# Patient Record
Sex: Male | Born: 1970 | Race: White | Hispanic: No | Marital: Married | Smoking: Never smoker
Health system: Southern US, Community
[De-identification: ages and names within clinical notes are randomized; demographics above are authoritative.]

## PROBLEM LIST (undated history)

## (undated) DIAGNOSIS — K219 Gastro-esophageal reflux disease without esophagitis: Secondary | ICD-10-CM

## (undated) DIAGNOSIS — F29 Unspecified psychosis not due to a substance or known physiological condition: Secondary | ICD-10-CM

## (undated) DIAGNOSIS — F419 Anxiety disorder, unspecified: Secondary | ICD-10-CM

## (undated) HISTORY — PX: NO PAST SURGERIES: SHX2092

---

## 2017-05-25 ENCOUNTER — Ambulatory Visit
Admission: RE | Admit: 2017-05-25 | Discharge: 2017-05-25 | Disposition: A | Discharge status: Home / Self Care | Payer: Managed Care, Other (non HMO) | Source: Ambulatory Visit | Attending: Medical Oncology | Admitting: Medical Oncology

## 2017-05-25 ENCOUNTER — Other Ambulatory Visit: Payer: Self-pay | Admitting: Medical Oncology

## 2017-05-25 DIAGNOSIS — M25561 Pain in right knee: Secondary | ICD-10-CM

## 2017-05-25 DIAGNOSIS — M1711 Unilateral primary osteoarthritis, right knee: Secondary | ICD-10-CM | POA: Insufficient documentation

## 2018-10-14 ENCOUNTER — Encounter: Payer: Self-pay | Admitting: Emergency Medicine

## 2018-10-14 ENCOUNTER — Emergency Department
Admission: EM | Admit: 2018-10-14 | Discharge: 2018-10-15 | Disposition: A | Discharge status: Other Acute Inpatient Hospital | Payer: Managed Care, Other (non HMO) | Attending: Emergency Medicine | Admitting: Emergency Medicine

## 2018-10-14 ENCOUNTER — Other Ambulatory Visit: Payer: Self-pay

## 2018-10-14 DIAGNOSIS — R440 Auditory hallucinations: Secondary | ICD-10-CM | POA: Diagnosis present

## 2018-10-14 DIAGNOSIS — F329 Major depressive disorder, single episode, unspecified: Secondary | ICD-10-CM | POA: Diagnosis not present

## 2018-10-14 DIAGNOSIS — Z20828 Contact with and (suspected) exposure to other viral communicable diseases: Secondary | ICD-10-CM | POA: Insufficient documentation

## 2018-10-14 LAB — CBC
HCT: 41.9 % (ref 39.0–52.0)
Hemoglobin: 14.3 g/dL (ref 13.0–17.0)
MCH: 29.6 pg (ref 26.0–34.0)
MCHC: 34.1 g/dL (ref 30.0–36.0)
MCV: 86.7 fL (ref 80.0–100.0)
Platelets: 275 10*3/uL (ref 150–400)
RBC: 4.83 MIL/uL (ref 4.22–5.81)
RDW: 12.3 % (ref 11.5–15.5)
WBC: 6.7 10*3/uL (ref 4.0–10.5)
nRBC: 0 % (ref 0.0–0.2)

## 2018-10-14 LAB — COMPREHENSIVE METABOLIC PANEL
ALT: 21 U/L (ref 0–44)
AST: 21 U/L (ref 15–41)
Albumin: 4.5 g/dL (ref 3.5–5.0)
Alkaline Phosphatase: 65 U/L (ref 38–126)
Anion gap: 7 (ref 5–15)
BUN: 19 mg/dL (ref 6–20)
CO2: 26 mmol/L (ref 22–32)
Calcium: 9.1 mg/dL (ref 8.9–10.3)
Chloride: 106 mmol/L (ref 98–111)
Creatinine, Ser: 0.97 mg/dL (ref 0.61–1.24)
GFR calc Af Amer: >60 mL/min (ref >60)
GFR calc non Af Amer: >60 mL/min (ref >60)
Glucose, Bld: 107 mg/dL — ABNORMAL HIGH (ref 70–99)
Potassium: 3.7 mmol/L (ref 3.5–5.1)
Sodium: 139 mmol/L (ref 135–145)
Total Bilirubin: 1.1 mg/dL (ref 0.3–1.2)
Total Protein: 7.5 g/dL (ref 6.5–8.1)

## 2018-10-14 LAB — URINE DRUG SCREEN, QUALITATIVE (ARMC ONLY)
Amphetamines, Ur Screen: NOT DETECTED
Barbiturates, Ur Screen: NOT DETECTED
Benzodiazepine, Ur Scrn: NOT DETECTED
Cannabinoid 50 Ng, Ur ~~LOC~~: NOT DETECTED
Cocaine Metabolite,Ur ~~LOC~~: NOT DETECTED
MDMA (Ecstasy)Ur Screen: NOT DETECTED
Methadone Scn, Ur: NOT DETECTED
Opiate, Ur Screen: NOT DETECTED
Phencyclidine (PCP) Ur S: NOT DETECTED
Tricyclic, Ur Screen: NOT DETECTED

## 2018-10-14 LAB — SALICYLATE LEVEL: Salicylate Lvl: <7 mg/dL (ref 2.8–30.0)

## 2018-10-14 LAB — TSH: TSH: 0.503 u[IU]/mL (ref 0.350–4.500)

## 2018-10-14 LAB — ACETAMINOPHEN LEVEL: Acetaminophen (Tylenol), Serum: <10 ug/mL — ABNORMAL LOW (ref 10–30)

## 2018-10-14 LAB — ETHANOL: Alcohol, Ethyl (B): <10 mg/dL (ref <10)

## 2018-10-14 LAB — SARS CORONAVIRUS 2 BY RT PCR (HOSPITAL ORDER, PERFORMED IN ~~LOC~~ HOSPITAL LAB): SARS Coronavirus 2: NEGATIVE

## 2018-10-14 NOTE — ED Triage Notes (Signed)
Pt states he's been hearing voices for a while now, is not on any medication for it, states the voices are making fun of him and his family, his voices are humiliating, wake him up out of his sleep. Had a colonoscopy 9 years ago, feels like something happened during the procedure to make him hear the thoughts and occasionally feels a "buzzing" in his stomach, denies si/hi.

## 2018-10-14 NOTE — ED Notes (Signed)
Hourly rounding reveals patient in room. No complaints, stable, in no acute distress. Q15 minute rounds and monitoring via Security Cameras to continue. 

## 2018-10-14 NOTE — ED Notes (Signed)
Pt given meal tray and water to drink. Pt calm and cooperative at this time. Pt expresses no further needs at this time.

## 2018-10-14 NOTE — ED Notes (Signed)
Introduced self to pt; provided blanket and water. Pt says he is hearing command hallucinations to harm self but is able to ignore the command. He denies SI/HI/AVH. Pt says these voices began about nine years ago following a colonoscopy; he says he had no prior mental health issues and has never sought treatment. Pt is cooperative, cordial, and anxious. Pt has been told to bring concerns, questions, and needs to staff attention. Will continue to monitor for needs/safety.

## 2018-10-14 NOTE — ED Notes (Signed)
FIRST NURSE NOTE: Pt reports hearing voices, pt arrived with wife.

## 2018-10-14 NOTE — ED Notes (Signed)
PMHNP and TTS are speaking with pt.

## 2018-10-14 NOTE — Consult Note (Signed)
[facility] Face-to-Face Psychiatry Consult   Reason for Consult:  Hallucinations  Referring Physician:  EDP Patient Identification: Eugene Johnson MRN:  [id number] Principal Diagnosis: Auditory hallucinations Diagnosis:  Principal Problem:   Auditory hallucinations  Total Time spent with patient: 1 hour  Subjective:   Eugene Johnson is a 48 y.o. male patient admitted with psychosis.  Reports he hears voices during the day and awakens him at night.  These have increased lately, started when he had a colonoscopy 9 years ago and feels they implanted a device to monitor him at that time.  Reports his home life with his wife and step children is "great", work is "great", no stressors identified.  One break about ten years ago when his girlfriend broke up with him and went to an inpatient psychiatric center where "I was misdiagnosed with bipolar disorder."  Reports this was changed later, only took Lexapro at this time.  No medications since this time for mental health issues.  HPI:  Per TTS:  48 y.o. male who presents to the ER via his wife, due to increase A/H. Per the report of patient, he start hearing voices approximately nine years ago, after his colonoscopy. However, the last three weeks they have worsened. They are starting to wake him up at night and it is becoming more difficult to consecrate.  He states the voice changes from male, to male and sometimes electronic. He also reports, his stomach buzzes and the voices tell him that it is them.  Patient was initially diagnosed with Bipolar but several years later he was told he was missed diagnosis and taking off medications.  Per the report of his wife [name]), he started hearing voices before they met. They have known each other for approximately seven years. She states, at times the patient has mood changes and other times he would curse at her or say rude things. When she asked him about it, he tells her the voices told him to say it. He also told  her, the closer someone gets to him the more the voices don't like them. Wife shared, the first time he was inpatient it was due to a suicide attempt, by overdosing. It was when he and his first wife ended their relationship.  During the interview, the patient was calm, cooperative and pleasant. He was able to provide appropriate answers to the questions. He denies the use of any mind-altering substance, beside the use of alcohol. He has no involvement with the legal system.   Past Psychiatric History: bipolar disorder  Risk to Self: Suicidal Ideation: No Suicidal Intent: No Is patient at risk for suicide?: No Suicidal Plan?: No Access to Means: No What has been your use of drugs/alcohol within the last 12 months?: Alcohol- Occasional use  How many times?: 1 Other Self Harm Risks: Reports of none Triggers for Past Attempts: Spouse contact Intentional Self Injurious Behavior: None Risk to Others: Homicidal Ideation: No Thoughts of Harm to Others: No Current Homicidal Intent: No Current Homicidal Plan: No Access to Homicidal Means: No Identified Victim: Reports of none  History of harm to others?: No Assessment of Violence: None Noted Violent Behavior Description: Reports of none  Does patient have access to weapons?: No Criminal Charges Pending?: No Does patient have a court date: No Prior Inpatient Therapy: Prior Inpatient Therapy: Yes Prior Outpatient Therapy:    Past Medical History: History reviewed. No pertinent past medical history. History reviewed. No pertinent surgical history. Family History: No family history on file. Family  Psychiatric  History: none Social History:  Social History   Substance and Sexual Activity  Alcohol Use None     Social History   Substance and Sexual Activity  Drug Use Not on file    Social History   Socioeconomic History  . Marital status: Married    Spouse name: Not on file  . Number of children: Not on file  . Years of  education: Not on file  . Highest education level: Not on file  Occupational History  . Not on file  Social Needs  . Financial resource strain: Not on file  . Food insecurity    Worry: Not on file    Inability: Not on file  . Transportation needs    Medical: Not on file    Non-medical: Not on file  Tobacco Use  . Smoking status: Not on file  Substance and Sexual Activity  . Alcohol use: Not on file  . Drug use: Not on file  . Sexual activity: Not on file  Lifestyle  . Physical activity    Days per week: Not on file    Minutes per session: Not on file  . Stress: Not on file  Relationships  . Social Herbalist on phone: Not on file    Gets together: Not on file    Attends religious service: Not on file    Active member of club or organization: Not on file    Attends meetings of clubs or organizations: Not on file    Relationship status: Not on file  Other Topics Concern  . Not on file  Social History Narrative  . Not on file   Additional Social History:    Allergies:  No Known Allergies  Labs:  Results for orders placed or performed during the hospital encounter of 10/14/18 (from the past 48 hour(s))  Comprehensive metabolic panel     Status: Abnormal   Collection Time: 10/14/18  2:52 PM  Result Value Ref Range   Sodium 139 135 - 145 mmol/L   Potassium 3.7 3.5 - 5.1 mmol/L   Chloride 106 98 - 111 mmol/L   CO2 26 22 - 32 mmol/L   Glucose, Bld 107 (H) 70 - 99 mg/dL   BUN 19 6 - 20 mg/dL   Creatinine, Ser 0.97 0.61 - 1.24 mg/dL   Calcium 9.1 8.9 - 10.3 mg/dL   Total Protein 7.5 6.5 - 8.1 g/dL   Albumin 4.5 3.5 - 5.0 g/dL   AST 21 15 - 41 U/L   ALT 21 0 - 44 U/L   Alkaline Phosphatase 65 38 - 126 U/L   Total Bilirubin 1.1 0.3 - 1.2 mg/dL   GFR calc non Af Amer >60 >60 mL/min   GFR calc Af Amer >60 >60 mL/min   Anion gap 7 5 - 15    Comment: Performed at [facility], [address] [place], [place] [address]  Ethanol     Status: None    Collection Time: 10/14/18  2:52 PM  Result Value Ref Range   Alcohol, Ethyl (B) <10 <10 mg/dL    Comment: (NOTE) Lowest detectable limit for serum alcohol is 10 mg/dL. For medical purposes only. Performed at [facility], [address] [place], [place] [address]   Salicylate level     Status: None   Collection Time: 10/14/18  2:52 PM  Result Value Ref Range   Salicylate Lvl <7.1 2.8 - 30.0 mg/dL    Comment:  Performed at [facility], [place], [place] [address]  Acetaminophen level     Status: Abnormal   Collection Time: 10/14/18  2:52 PM  Result Value Ref Range   Acetaminophen (Tylenol), Serum <10 (L) 10 - 30 ug/mL    Comment: (NOTE) Therapeutic concentrations vary significantly. A range of 10-30 ug/mL  may be an effective concentration for many patients. However, some  are best treated at concentrations outside of this range. Acetaminophen concentrations >150 ug/mL at 4 hours after ingestion  and >50 ug/mL at 12 hours after ingestion are often associated with  toxic reactions. Performed at [facility], [address] [place], [place] [address]   cbc     Status: None   Collection Time: 10/14/18  2:52 PM  Result Value Ref Range   WBC 6.7 4.0 - 10.5 K/uL   RBC 4.83 4.22 - 5.81 MIL/uL   Hemoglobin 14.3 13.0 - 17.0 g/dL   HCT 41.9 39.0 - 52.0 %   MCV 86.7 80.0 - 100.0 fL   MCH 29.6 26.0 - 34.0 pg   MCHC 34.1 30.0 - 36.0 g/dL   RDW 12.3 11.5 - 15.5 %   Platelets 275 150 - 400 K/uL   nRBC 0.0 0.0 - 0.2 %    Comment: Performed at [facility], [address], [place], [place] [address]  Urine Drug Screen, Qualitative     Status: None   Collection Time: 10/14/18  2:52 PM  Result Value Ref Range   Tricyclic, Ur Screen NONE DETECTED NONE DETECTED   Amphetamines, Ur Screen NONE DETECTED NONE DETECTED   MDMA (Ecstasy)Ur Screen NONE DETECTED NONE DETECTED   Cocaine Metabolite,Ur [place] NONE DETECTED NONE DETECTED   Opiate, Ur  Screen NONE DETECTED NONE DETECTED   Phencyclidine (PCP) Ur S NONE DETECTED NONE DETECTED   Cannabinoid 50 Ng, Ur  NONE DETECTED NONE DETECTED   Barbiturates, Ur Screen NONE DETECTED NONE DETECTED   Benzodiazepine, Ur Scrn NONE DETECTED NONE DETECTED   Methadone Scn, Ur NONE DETECTED NONE DETECTED    Comment: (NOTE) Tricyclics + metabolites, urine    Cutoff 1000 ng/mL Amphetamines + metabolites, urine  Cutoff 1000 ng/mL MDMA (Ecstasy), urine              Cutoff 500 ng/mL Cocaine Metabolite, urine          Cutoff 300 ng/mL Opiate + metabolites, urine        Cutoff 300 ng/mL Phencyclidine (PCP), urine         Cutoff 25 ng/mL Cannabinoid, urine                 Cutoff 50 ng/mL Barbiturates + metabolites, urine  Cutoff 200 ng/mL Benzodiazepine, urine              Cutoff 200 ng/mL Methadone, urine                   Cutoff 300 ng/mL The urine drug screen provides only a preliminary, unconfirmed analytical test result and should not be used for non-medical purposes. Clinical consideration and professional judgment should be applied to any positive drug screen result due to possible interfering substances. A more specific alternate chemical method must be used in order to obtain a confirmed analytical result. Gas chromatography / mass spectrometry (GC/MS) is the preferred confirmat ory method. Performed at [facility], [address] [place], [place] [address]   TSH     Status: None   Collection  Time: 10/14/18  2:52 PM  Result Value Ref Range   TSH 0.503 0.350 - 4.500 uIU/mL    Comment: Performed by a 3rd Generation assay with a functional sensitivity of <=0.01 uIU/mL. Performed at [facility], [address] [place], [place] [address]     No current facility-administered medications for this encounter.    Current Outpatient Medications  Medication Sig Dispense Refill  . acetaminophen (TYLENOL) 325 MG tablet Take 650 mg by mouth every 6 (six) hours as  needed.    . Multiple Vitamin (MULTIVITAMIN WITH MINERALS) TABS tablet Take 1 tablet by mouth daily.      Musculoskeletal: Strength & Muscle Tone: within normal limits Gait & Station: normal Patient leans: N/A  Psychiatric Specialty Exam: Physical Exam  Nursing note and vitals reviewed. Constitutional: He is oriented to person, place, and time. He appears well-developed and well-nourished.  HENT:  Head: Normocephalic.  Neck: Normal range of motion.  Respiratory: Effort normal.  Musculoskeletal: Normal range of motion.  Neurological: He is alert and oriented to person, place, and time.  Psychiatric: His speech is normal. His mood appears anxious. His affect is blunt. He is actively hallucinating. Thought content is paranoid and delusional. Cognition and memory are normal. He expresses impulsivity. He exhibits a depressed mood.    Review of Systems  Psychiatric/Behavioral: Positive for hallucinations.  All other systems reviewed and are negative.   Blood pressure (!) 144/83, pulse 87, temperature 98.7 F (37.1 C), temperature source Oral, resp. rate 18, height 5' 11"  (1.803 m), weight 102.1 kg, SpO2 98 %.Body mass index is 31.38 kg/m.  General Appearance: Casual  Eye Contact:  Good  Speech:  Normal Rate  Volume:  Normal  Mood:  Anxious and Depressed  Affect:  Blunt  Thought Process:  Coherent and Descriptions of Associations: Intact  Orientation:  Full (Time, Place, and Person)  Thought Content:  Delusions and Hallucinations: Auditory  Suicidal Thoughts:  No  Homicidal Thoughts:  No  Memory:  Immediate;   Fair Recent;   Fair Remote;   Fair  Judgement:  Impaired  Insight:  Fair  Psychomotor Activity:  Decreased  Concentration:  Concentration: Fair and Attention Span: Fair  Recall:  [employer] of Knowledge:  Fair  Language:  Good  Akathisia:  No  Handed:  Right  AIMS (if indicated):     Assets:  Communication Skills Desire for Improvement Financial  Resources/Insurance Housing Intimacy Leisure Time Physical Health Resilience Social Support Talents/Skills Transportation Vocational/Educational  ADL's:  Intact  Cognition:  WNL  Sleep:        Treatment Plan Summary: Daily contact with patient to assess and evaluate symptoms and progress in treatment, Medication management and Plan auditory hallucinations:  -none started due to being admitted inpatient -admitted to [facility] behavioral medicine  Disposition: Recommend psychiatric Inpatient admission when medically cleared.  Waylan Boga, NP 10/14/2018 6:08 PM

## 2018-10-14 NOTE — ED Notes (Signed)
Hourly rounding reveals patient sleeping in room. No complaints, stable, in no acute distress. Q15 minute rounds and monitoring via Security Cameras to continue. 

## 2018-10-14 NOTE — ED Notes (Signed)
Report to include Situation, Background, Assessment, and Recommendations received from Annie RN. Patient alert and oriented, warm and dry, in no acute distress. Patient denies SI, HI, AVH and pain. Patient made aware of Q15 minute rounds and security cameras for their safety. Patient instructed to come to me with needs or concerns. ? ?

## 2018-10-14 NOTE — BH Assessment (Signed)
Assessment Note  Eugene Johnson is an 48 y.o. male who presents to the ER via his wife, due to increase A/H. Per the report of patient, he start hearing voices approximately nine years ago, after his colonoscopy. However, the last three weeks they have worsened. They are starting to wake him up at night and it is becoming more difficult to consecrate.  He states the voice changes from male, to male and sometimes electronic. He also reports, his stomach buzzes and the voices tell him that it is them.  Patient was initially diagnosed with Bipolar but several years later he was told he was missed diagnosis and taking off medications.  Per the report of his wife Eugene Johnson), he started hearing voices before they met. They have known each other for approximately seven years. She states, at times the patient has mood changes and other times he would curse at her or say rude things. When she asked him about it, he tells her the voices told him to say it. He also told her, the closer someone gets to him the more the voices don't like them. Wife shared, the first time he was inpatient it was due to a suicide attempt, by overdosing. It was when he and his first wife ended their relationship.  During the interview, the patient was calm, cooperative and pleasant. He was able to provide appropriate answers to the questions. He denies the use of any mind-altering substance, beside the use of alcohol. He has no involvement with the legal system.   Diagnosis: Unspecified Psychosis   Past Medical History: History reviewed. No pertinent past medical history.  History reviewed. No pertinent surgical history.  Family History: No family history on file.  Social History:  has no history on file for tobacco, alcohol, and drug.  Additional Social History:  Alcohol / Drug Use Pain Medications: See PTA Prescriptions: See PTA Over the Counter: See PTA History of alcohol / drug use?: Yes Longest period of sobriety  (when/how long): n/a Substance #1 Name of Substance 1: Alcohol-Occasional use  CIWA: CIWA-Ar BP: (!) 144/83 Pulse Rate: 87 COWS:    Allergies: No Known Allergies  Home Medications: (Not in a hospital admission)   OB/GYN Status:  No LMP for male patient.  General Assessment Data Location of Assessment: [facility] ED TTS Assessment: In system Is this a Tele or Face-to-Face Assessment?: Face-to-Face Is this an Initial Assessment or a Re-assessment for this encounter?: Initial Assessment Patient Accompanied by:: Other(Wife) Language Other than English: No Living Arrangements: Other (Comment)(Private Home) What gender do you identify as?: Male Marital status: Married Pregnancy Status: No Living Arrangements: Spouse/significant other, Children Can pt return to current living arrangement?: Yes Admission Status: Voluntary Is patient capable of signing voluntary admission?: Yes Referral Source: Self/Family/Friend Insurance type: Ship broker Exam ([place]) Medical Exam completed: Yes  Crisis Care Plan Living Arrangements: Spouse/significant other, Children Legal Guardian: Other:(Self) Name of Psychiatrist: Reports of none Name of Therapist: Reports of none  Education Status Is patient currently in school?: No Is the patient employed, unemployed or receiving disability?: Employed  Risk to self with the past 6 months Suicidal Ideation: No Has patient been a risk to self within the past 6 months prior to admission? : No Suicidal Intent: No Has patient had any suicidal intent within the past 6 months prior to admission? : No Is patient at risk for suicide?: No Suicidal Plan?: No Has patient had any suicidal plan within the past 6 months prior  to admission? : No Access to Means: No What has been your use of drugs/alcohol within the last 12 months?: Alcohol- Occasional use  Previous Attempts/Gestures: Yes How many times?: 1 Other Self Harm Risks: Reports of  none Triggers for Past Attempts: Spouse contact Intentional Self Injurious Behavior: None Family Suicide History: Unknown Recent stressful life event(s): Other (Comment)(Increase in A/H) Persecutory voices/beliefs?: Yes Depression: Yes Depression Symptoms: Isolating, Feeling worthless/self pity Substance abuse history and/or treatment for substance abuse?: No Suicide prevention information given to non-admitted patients: Not applicable  Risk to Others within the past 6 months Homicidal Ideation: No Does patient have any lifetime risk of violence toward others beyond the six months prior to admission? : No Thoughts of Harm to Others: No Current Homicidal Intent: No Current Homicidal Plan: No Access to Homicidal Means: No Identified Victim: Reports of none  History of harm to others?: No Assessment of Violence: None Noted Violent Behavior Description: Reports of none  Does patient have access to weapons?: No Criminal Charges Pending?: No Does patient have a court date: No Is patient on probation?: No  Psychosis Hallucinations: Auditory Delusions: None noted  Mental Status Report Appearance/Hygiene: Unremarkable, In scrubs Eye Contact: Good Motor Activity: Freedom of movement, Unremarkable Speech: Logical/coherent, Unremarkable Level of Consciousness: Alert Mood: Helpless, Sad, Pleasant Affect: Sad, Flat Anxiety Level: Minimal Thought Processes: Coherent, Relevant Judgement: Unimpaired Orientation: Person, Place, Time, Situation, Appropriate for developmental age Obsessive Compulsive Thoughts/Behaviors: Minimal  Cognitive Functioning Concentration: Normal Memory: Recent Intact, Remote Intact Is patient IDD: No Insight: Fair Impulse Control: Fair Appetite: Good Have you had any weight changes? : No Change Sleep: No Change Total Hours of Sleep: 8 Vegetative Symptoms: None  ADLScreening [facility] Assessment Services) Patient's cognitive ability adequate to safely  complete daily activities?: Yes Patient able to express need for assistance with ADLs?: Yes Independently performs ADLs?: Yes (appropriate for developmental age)  Prior Inpatient Therapy Prior Inpatient Therapy: Yes Prior Therapy Dates: 2011 Prior Therapy Facilty/Provider(s): Hospital [place] state Reason for Treatment: Bipolar  Prior Outpatient Therapy Prior Outpatient Therapy: Yes Prior Therapy Dates: Unable to remember the date Prior Therapy Facilty/Provider(s): Unable to remember the name Reason for Treatment: Bipolar Does patient have an ACCT team?: No Does patient have Intensive In-House Services?  : No Does patient have [facility] services? : No Does patient have P4CC services?: No  ADL Screening (condition at time of admission) Patient's cognitive ability adequate to safely complete daily activities?: Yes Is the patient deaf or have difficulty hearing?: No Does the patient have difficulty seeing, even when wearing glasses/contacts?: No Does the patient have difficulty concentrating, remembering, or making decisions?: No Patient able to express need for assistance with ADLs?: Yes Does the patient have difficulty dressing or bathing?: No Independently performs ADLs?: Yes (appropriate for developmental age) Does the patient have difficulty walking or climbing stairs?: No Weakness of Legs: None Weakness of Arms/Hands: None  Home Assistive Devices/Equipment Home Assistive Devices/Equipment: None  Therapy Consults (therapy consults require a physician order) PT Evaluation Needed: No OT Evalulation Needed: No SLP Evaluation Needed: No Abuse/Neglect Assessment (Assessment to be complete while patient is alone) Abuse/Neglect Assessment Can Be Completed: Yes Physical Abuse: Denies Verbal Abuse: Denies Sexual Abuse: Denies Exploitation of patient/patient's resources: Denies Self-Neglect: Denies Values / Beliefs Cultural Requests During Hospitalization: None Spiritual  Requests During Hospitalization: None Consults Spiritual Care Consult Needed: No Social Work Consult Needed: No Regulatory affairs officer (For Healthcare) Does Patient Have a Medical Advance Directive?: No  Child/Adolescent Assessment Running Away Risk: Denies(Patient is an adult)  Disposition:  Disposition Initial Assessment Completed for this Encounter: Yes  On Site Evaluation by:   Reviewed with Physician:    Gunnar Fusi MS, [place], [facility], [place], [place] Therapeutic Triage Specialist 10/14/2018 6:17 PM

## 2018-10-14 NOTE — ED Notes (Signed)
Black shoes, black socks, gold chain, silver wedding band, iphone, khaki shorts, blue underwear, pink colored t shirt placed in hospital bag and labled with pt name and placed with quad RN.

## 2018-10-14 NOTE — ED Provider Notes (Signed)
[facility] Emergency Department Provider Note       Time seen: ----------------------------------------- 3:16 PM on 10/14/2018 -----------------------------------------   I have reviewed the triage vital signs and the nursing notes.  HISTORY   Chief Complaint Hearing Voices   HPI Eugene Johnson is a 48 y.o. male with no known past medical history who presents to the ED for hearing voices.  Patient states his been hearing voices for a while and is not taking any medications.  He reports the voices are making fun of him and his family.  He had a colonoscopy 9 years ago and felt like something happened during the procedure where he was implanted with something.  He does feel a buzzing sensation in his stomach.  He denies suicidal homicidal ideation.  History reviewed. No pertinent past medical history.  There are no active problems to display for this patient.   History reviewed. No pertinent surgical history.  Allergies Patient has no known allergies.  Social History Social History   Tobacco Use  . Smoking status: Not on file  Substance Use Topics  . Alcohol use: Not on file  . Drug use: Not on file   Review of Systems Constitutional: Negative for fever. Cardiovascular: Negative for chest pain. Respiratory: Negative for shortness of breath. Gastrointestinal: Negative for abdominal pain, vomiting and diarrhea. Musculoskeletal: Negative for back pain. Skin: Negative for rash. Neurological: Negative for headaches, focal weakness or numbness. Psychiatric: Positive for auditory hallucinations All systems negative/normal/unremarkable except as stated in the HPI  ____________________________________________   PHYSICAL EXAM:  VITAL SIGNS: ED Triage Vitals  Enc Vitals Group     BP 10/14/18 1445 (!) 144/83     Pulse Rate 10/14/18 1445 87     Resp 10/14/18 1445 18     Temp 10/14/18 1445 98.7 F (37.1 C)     Temp Source 10/14/18 1445 Oral   SpO2 10/14/18 1445 98 %     Weight 10/14/18 1448 225 lb (102.1 kg)     Height 10/14/18 1448 5\' 11"  (1.803 m)     Head Circumference --      Peak Flow --      Pain Score 10/14/18 1448 0     Pain Loc --      Pain Edu? --      Excl. in [place]? --    Constitutional: Alert and oriented. Well appearing and in no distress. Eyes: Conjunctivae are normal. Normal extraocular movements. ENT      Head: Normocephalic and atraumatic.      Nose: No congestion/rhinnorhea.      Mouth/Throat: Mucous membranes are moist.      Neck: No stridor. Cardiovascular: Normal rate, regular rhythm. No murmurs, rubs, or gallops. Respiratory: Normal respiratory effort without tachypnea nor retractions. Breath sounds are clear and equal bilaterally. No wheezes/rales/rhonchi. Gastrointestinal: Soft and nontender. Normal bowel sounds Musculoskeletal: Nontender with normal range of motion in extremities. No lower extremity tenderness nor edema. Neurologic:  Normal speech and language. No gross focal neurologic deficits are appreciated.  Skin:  Skin is warm, dry and intact. No rash noted. Psychiatric: Mood and affect are normal. Speech and behavior are normal.  ____________________________________________  ED COURSE:  As part of my medical decision making, I reviewed the following data within the [place] History obtained from family if available, nursing notes, old chart and ekg, as well as notes from prior ED visits. Patient presented for auditory hallucinations, we will assess with labs and imaging as indicated  at this time.   Procedures  Eugene Johnson was evaluated in Emergency Department on 10/14/2018 for the symptoms described in the history of present illness. He was evaluated in the context of the global COVID-19 pandemic, which necessitated consideration that the patient might be at risk for infection with the SARS-CoV-2 virus that causes COVID-19. Institutional protocols and algorithms that  pertain to the evaluation of patients at risk for COVID-19 are in a state of rapid change based on information released by regulatory bodies including the CDC and federal and state organizations. These policies and algorithms were followed during the patient's care in the ED.  ____________________________________________   LABS (pertinent positives/negatives)  Labs Reviewed  CBC  COMPREHENSIVE METABOLIC PANEL  ETHANOL  SALICYLATE LEVEL  ACETAMINOPHEN LEVEL  URINE DRUG SCREEN, QUALITATIVE ([place])  ___________________________________________   DIFFERENTIAL DIAGNOSIS   Auditory hallucinations, schizophrenia, bipolar disorder  FINAL ASSESSMENT AND PLAN  Auditory hallucinations   Plan: The patient had presented for auditory hallucinations. Patient's labs are unremarkable.  He appears medically clear for psychiatric evaluation and disposition.   Laurence Aly, MD    Note: This note was generated in part or whole with voice recognition software. Voice recognition is usually quite accurate but there are transcription errors that can and very often do occur. I apologize for any typographical errors that were not detected and corrected.     Earleen Newport, MD 10/14/18 [phone]

## 2018-10-15 ENCOUNTER — Inpatient Hospital Stay
Admission: RE | Admit: 2018-10-15 | Discharge: 2018-10-18 | DRG: 885 | Disposition: A | Discharge status: Home / Self Care | Payer: 59 | Source: Intra-hospital | Attending: Psychiatry | Admitting: Psychiatry

## 2018-10-15 ENCOUNTER — Other Ambulatory Visit: Payer: Self-pay

## 2018-10-15 ENCOUNTER — Inpatient Hospital Stay: Admit: 2018-10-15 | Payer: 59 | Source: Ambulatory Visit | Admitting: Psychiatry

## 2018-10-15 DIAGNOSIS — Z915 Personal history of self-harm: Secondary | ICD-10-CM

## 2018-10-15 DIAGNOSIS — Z1159 Encounter for screening for other viral diseases: Secondary | ICD-10-CM | POA: Diagnosis not present

## 2018-10-15 DIAGNOSIS — F29 Unspecified psychosis not due to a substance or known physiological condition: Principal | ICD-10-CM

## 2018-10-15 DIAGNOSIS — Z5181 Encounter for therapeutic drug level monitoring: Secondary | ICD-10-CM | POA: Diagnosis not present

## 2018-10-15 DIAGNOSIS — Z79899 Other long term (current) drug therapy: Secondary | ICD-10-CM

## 2018-10-15 DIAGNOSIS — R440 Auditory hallucinations: Secondary | ICD-10-CM | POA: Diagnosis present

## 2018-10-15 MED ORDER — ALUM & MAG HYDROXIDE-SIMETH 200-200-20 MG/5ML PO SUSP: 30.0000 mL | ORAL | Status: DC | PRN

## 2018-10-15 MED ORDER — MAGNESIUM HYDROXIDE 400 MG/5ML PO SUSP: 30.0000 mL | Freq: Every day | ORAL | Status: DC | PRN

## 2018-10-15 MED ORDER — ACETAMINOPHEN 325 MG PO TABS: 650.0000 mg | ORAL_TABLET | Freq: Four times a day (QID) | ORAL | Status: DC | PRN

## 2018-10-15 NOTE — ED Notes (Signed)
Hourly rounding reveals patient sleeping in room. No complaints, stable, in no acute distress. Q15 minute rounds and monitoring via Security Cameras to continue. 

## 2018-10-15 NOTE — BH Assessment (Signed)
Patient is to be admitted to [facility] by Dr. Einar Grad.  Attending Physician will be Dr. Weber Cooks.   Patient has been assigned to room 322, by [place] Nurse T'Yawn.   Intake Paper Work has been signed and placed on patient chart.   ER staff is aware of the admission:  Glenda, ER Secretary    Dr. Corky Downs, ER MD   Amy B., Patient's Nurse   Meredith Mody, Patient Access.
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
## 2018-10-15 NOTE — ED Notes (Signed)
Pt discharged to BMU. Pt has signed voluntary consent form. VS stable. Pt is calm and cooperative. Belongings will be sent with patient.

## 2018-10-15 NOTE — Plan of Care (Signed)
Patient newly admitted to the unit, denies having any thoughts of SI/AVH/HI and pain at this time. Will continue to monitor.

## 2018-10-15 NOTE — ED Notes (Signed)
Pt being admitted to BMU per TTS

## 2018-10-15 NOTE — Tx Team (Addendum)
Initial Treatment Plan 10/15/2018 3:55 PM [name] PJS:[id number]    PATIENT STRESSORS: Traumatic event Other: "I just want the voices to stop."   PATIENT STRENGTHS: Financial means General fund of knowledge Motivation for treatment/growth Religious Affiliation Supportive family/friends Work skills   PATIENT IDENTIFIED PROBLEMS: Hallucinations                     DISCHARGE CRITERIA:  Improved stabilization in mood, thinking, and/or behavior Patient will no longer experience auditory hallucinations  PRELIMINARY DISCHARGE PLAN: Outpatient therapy Return to previous living arrangement Return to previous work or school arrangements  PATIENT/FAMILY INVOLVEMENT: This treatment plan has been presented to and reviewed with the patient, Eugene Johnson, and/or family membe.  The patient and family have been given the opportunity to ask questions and make suggestions.  Parnika Tweten L Nysir Fergusson, RN 10/15/2018, 3:55 PM

## 2018-10-15 NOTE — ED Notes (Signed)
Hourly rounding reveals patient in room. No complaints, stable, in no acute distress. Q15 minute rounds and monitoring via Security Cameras to continue. 

## 2018-10-16 ENCOUNTER — Inpatient Hospital Stay: Payer: 59

## 2018-10-16 ENCOUNTER — Other Ambulatory Visit: Payer: Self-pay

## 2018-10-16 DIAGNOSIS — Z5181 Encounter for therapeutic drug level monitoring: Secondary | ICD-10-CM

## 2018-10-16 DIAGNOSIS — F29 Unspecified psychosis not due to a substance or known physiological condition: Principal | ICD-10-CM

## 2018-10-16 LAB — LIPID PANEL
Cholesterol: 170 mg/dL (ref 0–200)
HDL: 37 mg/dL — ABNORMAL LOW (ref >40)
LDL Cholesterol: 82 mg/dL (ref 0–99)
Total CHOL/HDL Ratio: 4.6 RATIO
Triglycerides: 253 mg/dL — ABNORMAL HIGH (ref <150)
VLDL: 51 mg/dL — ABNORMAL HIGH (ref 0–40)

## 2018-10-16 LAB — HEMOGLOBIN A1C
Hgb A1c MFr Bld: 5.5 % (ref 4.8–5.6)
Mean Plasma Glucose: 111.15 mg/dL

## 2018-10-16 MED ORDER — GADOBUTROL 1 MMOL/ML IV SOLN
10.0000 mL | Freq: Once | INTRAVENOUS | Status: AC | PRN
  Administered 2018-10-16: 17:00:00 10 mL via INTRAVENOUS

## 2018-10-16 MED ORDER — PALIPERIDONE ER 3 MG PO TB24
6.0000 mg | ORAL_TABLET | Freq: Every day | ORAL | Status: DC
  Administered 2018-10-16 – 2018-10-17 (×2): 6 mg via ORAL
  Filled 2018-10-16 (×2): qty 2

## 2018-10-16 NOTE — BHH Suicide Risk Assessment (Signed)
[facility] Admission Suicide Risk Assessment   Nursing information obtained from:  Patient Demographic factors:  Male, Caucasian Current Mental Status:  NA Loss Factors:  NA Historical Factors:  Family history of mental illness or substance abuse Risk Reduction Factors:  Sense of responsibility to family, Responsible for children under 49 years of age, Employed, Living with another person, especially a relative  Total Time spent with patient: 1 hour Principal Problem: <principal problem not specified> Diagnosis:  Active Problems:   Auditory hallucinations  Subjective Data: Patient seen and chart reviewed.  48 year old man presents with auditory hallucinations that have been present for years but have been dramatically more disturbing in the last few weeks.  Hallucinations are present almost constantly and are filled with negative content and are causing disruption and making it difficult for him to function.  Patient denies any feelings of depression.  Denies any changes in sleep or appetite.  He does admit that he vaguely thinks that the hallucinations might be coming from something that was "implanted" into him during a colonoscopy several years ago but other than that I cannot elicit any psychotic symptoms.  He denies any suicidal thoughts whatsoever.  Denies homicidal ideation.  Continued Clinical Symptoms:  Alcohol Use Disorder Identification Test Final Score (AUDIT): 4 The "Alcohol Use Disorders Identification Test", Guidelines for Use in Primary Care, Second Edition.  World Pharmacologist [facility]). Score between 0-7:  no or low risk or alcohol related problems. Score between 8-15:  moderate risk of alcohol related problems. Score between 16-19:  high risk of alcohol related problems. Score 20 or above:  warrants further diagnostic evaluation for alcohol dependence and treatment.   CLINICAL FACTORS:   Currently Psychotic   Musculoskeletal: Strength & Muscle Tone: within normal  limits Gait & Station: normal Patient leans: N/A  Psychiatric Specialty Exam: Physical Exam  Nursing note and vitals reviewed. Constitutional: He appears well-developed and well-nourished.  HENT:  Head: Normocephalic and atraumatic.  Eyes: Pupils are equal, round, and reactive to light. Conjunctivae are normal.  Neck: Normal range of motion.  Cardiovascular: Regular rhythm and normal heart sounds.  Respiratory: Effort normal. No respiratory distress.  GI: Soft.  Musculoskeletal: Normal range of motion.  Neurological: He is alert.  Skin: Skin is warm and dry.  Psychiatric: His speech is normal. Judgment normal. His affect is blunt. He is actively hallucinating. Thought content is paranoid. Cognition and memory are normal.    Review of Systems  Constitutional: Negative.   HENT: Negative.   Eyes: Negative.   Respiratory: Negative.   Cardiovascular: Negative.   Gastrointestinal: Negative.   Musculoskeletal: Negative.   Skin: Negative.   Neurological: Negative.   Psychiatric/Behavioral: Positive for hallucinations. Negative for depression, memory loss, substance abuse and suicidal ideas. The patient is not nervous/anxious and does not have insomnia.     Blood pressure 113/74, pulse 60, temperature (!) 97.5 F (36.4 C), temperature source Oral, resp. rate 18, height 5\' 11"  (1.803 m), weight 102.1 kg, SpO2 99 %.Body mass index is 31.38 kg/m.  General Appearance: Fairly Groomed  Eye Contact:  Fair  Speech:  Normal Rate  Volume:  Normal  Mood:  Euthymic  Affect:  Constricted  Thought Process:  Goal Directed  Orientation:  Full (Time, Place, and Person)  Thought Content:  Logical and Hallucinations: Auditory  Suicidal Thoughts:  No  Homicidal Thoughts:  No  Memory:  Immediate;   Fair Recent;   Fair Remote;   Fair  Judgement:  Fair  Insight:  Shallow  Psychomotor Activity:  Decreased  Concentration:  Concentration: Fair  Recall:  [employer] of Knowledge:  Fair  Language:   Fair  Akathisia:  No  Handed:  Right  AIMS (if indicated):     Assets:  Communication Skills Desire for Improvement Financial Resources/Insurance [place] Talents/Skills Vocational/Educational  ADL's:  Intact  Cognition:  WNL  Sleep:  Number of Hours: 7.5      COGNITIVE FEATURES THAT CONTRIBUTE TO RISK:  None    SUICIDE RISK:   Mild:  Suicidal ideation of limited frequency, intensity, duration, and specificity.  There are no identifiable plans, no associated intent, mild dysphoria and related symptoms, good self-control (both objective and subjective assessment), few other risk factors, and identifiable protective factors, including available and accessible social support.  PLAN OF CARE: Patient seen and chart reviewed.  Patient with atypical hallucinations late life onset without the accompaniment of mood symptoms or other typical schizophrenic symptoms.  Patient will continue on 15-minute checks.  Work-up will be completed and we will start psychiatric medicine.  Patient suicidality will be reassessed along with other symptoms prior to discharge.  I certify that inpatient services furnished can reasonably be expected to improve the patient's condition.   Alethia Berthold, MD 10/16/2018, 2:05 PM
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
## 2018-10-16 NOTE — BHH Suicide Risk Assessment (Signed)
[place] INPATIENT:  Family/Significant Other Suicide Prevention Education  Suicide Prevention Education:  Contact Attempts: Nijee Heatwole, wife [phone] has been identified by the patient as the family member/significant other with whom the patient will be residing, and identified as the person(s) who will aid the patient in the event of a mental health crisis.  With written consent from the patient, two attempts were made to provide suicide prevention education, prior to and/or following the patient's discharge.  We were unsuccessful in providing suicide prevention education.  A suicide education pamphlet was given to the patient to share with family/significant other.  Date and time of first attempt: 10-16-18 945am CSW left confidential VM Date and time of second attempt: 2nd attempt needed  Iris Hairston T Siona Coulston 10/16/2018, 9:47 AM

## 2018-10-16 NOTE — Plan of Care (Signed)
Sleep is adequate no distress. No complains.   Problem: Activity: Goal: Will verbalize the importance of balancing activity with adequate rest periods Outcome: Progressing   Problem: Education: Goal: Will be free of psychotic symptoms Outcome: Progressing Goal: Knowledge of the prescribed therapeutic regimen will improve Outcome: Progressing   Problem: Coping: Goal: Coping ability will improve Outcome: Progressing Goal: Will verbalize feelings Outcome: Progressing   Problem: Health Behavior/Discharge Planning: Goal: Compliance with prescribed medication regimen will improve Outcome: Progressing   Problem: Nutritional: Goal: Ability to achieve adequate nutritional intake will improve Outcome: Progressing   Problem: Safety: Goal: Ability to redirect hostility and anger into socially appropriate behaviors will improve Outcome: Progressing Goal: Ability to remain free from injury will improve Outcome: Progressing   Problem: Self-Care: Goal: Ability to participate in self-care as condition permits will improve Outcome: Progressing   Problem: Self-Concept: Goal: Will verbalize positive feelings about self Outcome: Progressing   Problem: Education: Goal: Knowledge of [place] [facility] Education information/materials will improve Outcome: Progressing Goal: Emotional status will improve Outcome: Progressing Goal: Mental status will improve Outcome: Progressing Goal: Verbalization of understanding the information provided will improve Outcome: Progressing   Problem: Health Behavior/Discharge Planning: Goal: Identification of resources available to assist in meeting health care needs will improve Outcome: Progressing Goal: Compliance with treatment plan for underlying cause of condition will improve Outcome: Progressing   Problem: Safety: Goal: Periods of time without injury will increase Outcome: Progressing

## 2018-10-16 NOTE — Progress Notes (Signed)
Pt has been calm, cooperative today. He has been active and sociable in group, dayroom and courtyard. He has denied everything but auditory hallucinations. MRI was ordered and completed today. Collier Bullock RN

## 2018-10-16 NOTE — Progress Notes (Signed)
Recreation Therapy Notes   Date: 10/16/2018  Time: 9:30 am   Location: Craft room   Behavioral response: N/A   Intervention Topic: Goals  Discussion/Intervention: Patient did not attend group.   Clinical Observations/Feedback:  Patient did not attend group.   Nilan Iddings LRT/CTRS        Lilianna Case 10/16/2018 11:02 AM

## 2018-10-16 NOTE — [facility] Group Notes (Signed)
Feelings Around Diagnosis 10/16/2018 1PM  Type of Therapy/Topic:  Group Therapy:  Feelings about Diagnosis  Participation Level:  Active   Description of Group:   This group will allow patients to explore their thoughts and feelings about diagnoses they have received. Patients will be guided to explore their level of understanding and acceptance of these diagnoses. Facilitator will encourage patients to process their thoughts and feelings about the reactions of others to their diagnosis and will guide patients in identifying ways to discuss their diagnosis with significant others in their lives. This group will be process-oriented, with patients participating in exploration of their own experiences, giving and receiving support, and processing challenge from other group members.   Therapeutic Goals: 1. Patient will demonstrate understanding of diagnosis as evidenced by identifying two or more symptoms of the disorder 2. Patient will be able to express two feelings regarding the diagnosis 3. Patient will demonstrate their ability to communicate their needs through discussion and/or role play  Summary of Patient Progress:  Actively and appropriately engaged in the group. Patient was able to provide support and validation to other group members.Patient practiced active listening when interacting with the facilitator and other group members.    Therapeutic Modalities:   Cognitive Behavioral Therapy Brief Therapy Feelings Identification    Yvette Rack, LCSW 10/16/2018 2:04 PM

## 2018-10-16 NOTE — H&P (Signed)
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  48 year old man presented to the emergency room along with his wife with complaints of auditory hallucinations.  On interview today the patient gives the same history.  He states that he hears voices and has been doing so for approximately 9 years.  He says that they gradually started after he had a colonoscopy at that time.  In the interval since then he has mostly been able to ignore them but has always had some awareness of them happening from time to time.  Usually they will make derogatory comments about him or make ridiculing comments about people around him.  Over the past 3 weeks the symptoms have gotten substantially worse.  They are much louder and more constant.  At times they are waking him up for sleep.  Frequently they will make ridiculing comments about his family or people around him and he is starting to believe that other people can hear them to.  Patient denies being depressed.  Denies a sense of sadness or hopelessness.  Denies any change in his appetite denies any change in his sleep patterns.  He denies any suicidal thoughts at all.  Denies homicidal ideation.  He does admit that he has some belief that maybe the voices are coming from some kind of device that was implanted into him during the colonoscopy but that does not seem to be the primary concern.  Patient denies any thought of hurting himself or anyone else.  He is not on any psychiatric medicine.  He says he drinks alcohol only occasionally and does not abuse substances.  Wife is backed up all of this history.  Patient denies any new changes in his life or emotional  strain that he is aware of. Associated Signs/Symptoms: Depression Symptoms:  difficulty concentrating, (Hypo) Manic Symptoms:  Hallucinations, Anxiety Symptoms:  Nothing specific Psychotic Symptoms:  Hallucinations: Auditory Paranoia, PTSD Symptoms: Negative Total Time spent with patient: 1 hour  Past Psychiatric History: Patient reports that he has had these symptoms for several years.  He was evasive in telling me about his past history but I confronted him with information in the chart and he agreed that he had had 1 psychiatric hospitalization several years ago.  This was in [place] and was around the time that he and his previous wife broke up.  He had a suicide attempt at that time.  Patient says that at the time he was "misdiagnosed" with bipolar disorder but after the hospitalization was taken off of antipsychotics.  He was on antidepressants and clonazepam for a little while but has not been getting any follow-up since then.  No subsequent suicide attempts.  Denies any history of violence.  Is the patient at risk to self? Yes.    Has the patient been a risk to self in the past 6 months? No.  Has the patient been a risk to self within the distant past? Yes.    Is the patient a risk to others? No.  Has the patient been a risk to others in the past 6 months? No.  Has the patient been a risk to others within the distant past? No.   Prior Inpatient Therapy:   Prior Outpatient Therapy:    Alcohol  Screening: 1. How often do you have a drink containing alcohol?: 2 to 4 times a month 2. How many drinks containing alcohol do you have on a typical day when you are drinking?: 3 or 4 3. How often do you have six or more drinks on one occasion?: Less than monthly AUDIT-C Score: 4 4. How often during the last year have you found that you were not able to stop drinking once you had started?: Never 5. How often during the last year have you failed to do what was normally expected from you  becasue of drinking?: Never 6. How often during the last year have you needed a first drink in the morning to get yourself going after a heavy drinking session?: Never 7. How often during the last year have you had a feeling of guilt of remorse after drinking?: Never 8. How often during the last year have you been unable to remember what happened the night before because you had been drinking?: Never 9. Have you or someone else been injured as a result of your drinking?: No 10. Has a relative or friend or a doctor or another health worker been concerned about your drinking or suggested you cut down?: No Alcohol Use Disorder Identification Test Final Score (AUDIT): 4 Alcohol Brief Interventions/Follow-up: AUDIT Score <7 follow-up not indicated Substance Abuse History in the last 12 months:  No. Consequences of Substance Abuse: Negative Previous Psychotropic Medications: Yes  Psychological Evaluations: Yes  Past Medical History: History reviewed. No pertinent past medical history. History reviewed. No pertinent surgical history. Family History: History reviewed. No pertinent family history. Family Psychiatric  History: Patient denies any family history of mental health problems at all. Tobacco Screening: Have you used any form of tobacco in the last 30 days? (Cigarettes, Smokeless Tobacco, Cigars, and/or Pipes): No Social History:  Social History   Substance and Sexual Activity  Alcohol Use None     Social History   Substance and Sexual Activity  Drug Use Not on file    Additional Social History: Marital status: Married Number of Years Married: 3 What types of issues is patient dealing with in the relationship?: None reported, states having a good relationship with wife Additional relationship information: N/A What is your sexual orientation?: Heterosexual Has your sexual activity been affected by drugs, alcohol, medication, or emotional stress?: No Does patient have children?:  Yes How many children?: 5 How is patient's relationship with their children?: Pt reports having a good relationship with his children    Pain Medications: see PTA Prescriptions: see PTA Over the Counter: see PTA History of alcohol / drug use?: No history of alcohol / drug abuse                    Allergies:  No Known Allergies Lab Results:  Results for orders placed or performed during the hospital encounter of 10/14/18 (from the past 48 hour(s))  Comprehensive metabolic panel     Status: Abnormal   Collection Time: 10/14/18  2:52 PM  Result Value Ref Range   Sodium 139 135 - 145 mmol/L   Potassium 3.7 3.5 - 5.1 mmol/L   Chloride 106 98 - 111 mmol/L   CO2 26 22 - 32 mmol/L   Glucose, Bld 107 (H) 70 - 99 mg/dL   BUN 19 6 - 20 mg/dL   Creatinine, Ser 0.97 0.61 - 1.24 mg/dL   Calcium 9.1 8.9 - 10.3 mg/dL   Total Protein 7.5 6.5 - 8.1 g/dL  Albumin 4.5 3.5 - 5.0 g/dL   AST 21 15 - 41 U/L   ALT 21 0 - 44 U/L   Alkaline Phosphatase 65 38 - 126 U/L   Total Bilirubin 1.1 0.3 - 1.2 mg/dL   GFR calc non Af Amer >60 >60 mL/min   GFR calc Af Amer >60 >60 mL/min   Anion gap 7 5 - 15    Comment: Performed at [facility] [address] [place], [place] [address]  Ethanol     Status: None   Collection Time: 10/14/18  2:52 PM  Result Value Ref Range   Alcohol, Ethyl (B) <10 <10 mg/dL    Comment: (NOTE) Lowest detectable limit for serum alcohol is 10 mg/dL. For medical purposes only. Performed at [facility], [address] [place], [place] [address]   Salicylate level     Status: None   Collection Time: 10/14/18  2:52 PM  Result Value Ref Range   Salicylate Lvl <1.2 2.8 - 30.0 mg/dL    Comment: Performed at [facility], [place], [place] [address]  Acetaminophen level     Status: Abnormal   Collection Time: 10/14/18  2:52 PM  Result Value Ref Range   Acetaminophen (Tylenol), Serum <10 (L) 10 - 30 ug/mL    Comment:  (NOTE) Therapeutic concentrations vary significantly. A range of 10-30 ug/mL  may be an effective concentration for many patients. However, some  are best treated at concentrations outside of this range. Acetaminophen concentrations >150 ug/mL at 4 hours after ingestion  and >50 ug/mL at 12 hours after ingestion are often associated with  toxic reactions. Performed at [facility], [address] [place], [place] [address]   cbc     Status: None   Collection Time: 10/14/18  2:52 PM  Result Value Ref Range   WBC 6.7 4.0 - 10.5 K/uL   RBC 4.83 4.22 - 5.81 MIL/uL   Hemoglobin 14.3 13.0 - 17.0 g/dL   HCT 41.9 39.0 - 52.0 %   MCV 86.7 80.0 - 100.0 fL   MCH 29.6 26.0 - 34.0 pg   MCHC 34.1 30.0 - 36.0 g/dL   RDW 12.3 11.5 - 15.5 %   Platelets 275 150 - 400 K/uL   nRBC 0.0 0.0 - 0.2 %    Comment: Performed at [facility], [address], [place], [place] [address]  Urine Drug Screen, Qualitative     Status: None   Collection Time: 10/14/18  2:52 PM  Result Value Ref Range   Tricyclic, Ur Screen NONE DETECTED NONE DETECTED   Amphetamines, Ur Screen NONE DETECTED NONE DETECTED   MDMA (Ecstasy)Ur Screen NONE DETECTED NONE DETECTED   Cocaine Metabolite,Ur [place] NONE DETECTED NONE DETECTED   Opiate, Ur Screen NONE DETECTED NONE DETECTED   Phencyclidine (PCP) Ur S NONE DETECTED NONE DETECTED   Cannabinoid 50 Ng, Ur [place] NONE DETECTED NONE DETECTED   Barbiturates, Ur Screen NONE DETECTED NONE DETECTED   Benzodiazepine, Ur Scrn NONE DETECTED NONE DETECTED   Methadone Scn, Ur NONE DETECTED NONE DETECTED    Comment: (NOTE) Tricyclics + metabolites, urine    Cutoff 1000 ng/mL Amphetamines + metabolites, urine  Cutoff 1000 ng/mL MDMA (Ecstasy), urine              Cutoff 500 ng/mL Cocaine Metabolite, urine          Cutoff 300 ng/mL Opiate + metabolites, urine        Cutoff  300 ng/mL Phencyclidine (PCP), urine         Cutoff 25 ng/mL Cannabinoid, urine                 Cutoff 50  ng/mL Barbiturates + metabolites, urine  Cutoff 200 ng/mL Benzodiazepine, urine              Cutoff 200 ng/mL Methadone, urine                   Cutoff 300 ng/mL The urine drug screen provides only a preliminary, unconfirmed analytical test result and should not be used for non-medical purposes. Clinical consideration and professional judgment should be applied to any positive drug screen result due to possible interfering substances. A more specific alternate chemical method must be used in order to obtain a confirmed analytical result. Gas chromatography / mass spectrometry (GC/MS) is the preferred confirmat ory method. Performed at [facility], [address] [place], [place] [address]   TSH     Status: None   Collection Time: 10/14/18  2:52 PM  Result Value Ref Range   TSH 0.503 0.350 - 4.500 uIU/mL    Comment: Performed by a 3rd Generation assay with a functional sensitivity of <=0.01 uIU/mL. Performed at [facility], [address], [place], [place] [address]   SARS Coronavirus 2 (CEPHEID - Performed in [facility] hospital lab), Hosp Order     Status: None   Collection Time: 10/14/18  5:16 PM   Specimen: Nasopharyngeal  Result Value Ref Range   SARS Coronavirus 2 NEGATIVE NEGATIVE    Comment: (NOTE) If result is NEGATIVE SARS-CoV-2 target nucleic acids are NOT DETECTED. The SARS-CoV-2 RNA is generally detectable in upper and lower  respiratory specimens during the acute phase of infection. The lowest  concentration of SARS-CoV-2 viral copies this assay can detect is 250  copies / mL. A negative result does not preclude SARS-CoV-2 infection  and should not be used as the sole basis for treatment or other  patient management decisions.  A negative result may occur with  improper specimen collection / handling, submission of specimen other  than nasopharyngeal swab, presence of viral mutation(s) within the  areas targeted by this assay, and inadequate  number of viral copies  (<250 copies / mL). A negative result must be combined with clinical  observations, patient history, and epidemiological information. If result is POSITIVE SARS-CoV-2 target nucleic acids are DETECTED. The SARS-CoV-2 RNA is generally detectable in upper and lower  respiratory specimens dur ing the acute phase of infection.  Positive  results are indicative of active infection with SARS-CoV-2.  Clinical  correlation with patient history and other diagnostic information is  necessary to determine patient infection status.  Positive results do  not rule out bacterial infection or co-infection with other viruses. If result is PRESUMPTIVE POSTIVE SARS-CoV-2 nucleic acids MAY BE PRESENT.   A presumptive positive result was obtained on the submitted specimen  and confirmed on repeat testing.  While 2019 novel coronavirus  (SARS-CoV-2) nucleic acids may be present in the submitted sample  additional confirmatory testing may be necessary for epidemiological  and / or clinical management purposes  to differentiate between  SARS-CoV-2 and other Sarbecovirus currently known to infect humans.  If clinically indicated additional testing with an alternate test  methodology [id number]) is advised. The SARS-CoV-2 RNA is generally  detectable in upper and lower respiratory sp ecimens during the acute  phase of infection. The expected result is  Negative. Fact Sheet for Patients:  StrictlyIdeas.no Fact Sheet for Healthcare Providers: BankingDealers.co.za This test is not yet approved or cleared by the [place] FDA and has been authorized for detection and/or diagnosis of SARS-CoV-2 by FDA under an Emergency Use Authorization (EUA).  This EUA will remain in effect (meaning this test can be used) for the duration of the COVID-19 declaration under Section 564(b)(1) of the Act, 21 U.S.C. section 360bbb-3(b)(1), unless the  authorization is terminated or revoked sooner. Performed at [facility], [address] [place], [place] [address]     Blood Alcohol level:  Lab Results  Component Value Date   [facility] <10 89/21/1942    Metabolic Disorder Labs:  No results found for: HGBA1C, MPG No results found for: PROLACTIN No results found for: CHOL, TRIG, HDL, CHOLHDL, VLDL, LDLCALC  Current Medications: Current Facility-Administered Medications  Medication Dose Route Frequency Provider Last Rate Last Dose  . acetaminophen (TYLENOL) tablet 650 mg  650 mg Oral Q6H PRN Patrecia Pour, NP      . alum & mag hydroxide-simeth (MAALOX/MYLANTA) 200-200-20 MG/5ML suspension 30 mL  30 mL Oral Q4H PRN Patrecia Pour, NP      . magnesium hydroxide (MILK OF MAGNESIA) suspension 30 mL  30 mL Oral Daily PRN Patrecia Pour, NP      . paliperidone (INVEGA) 24 hr tablet 6 mg  6 mg Oral QHS Clapacs, Madie Reno, MD       PTA Medications: Medications Prior to Admission  Medication Sig Dispense Refill Last Dose  . acetaminophen (TYLENOL) 325 MG tablet Take 650 mg by mouth every 6 (six) hours as needed.     . Multiple Vitamin (MULTIVITAMIN WITH MINERALS) TABS tablet Take 1 tablet by mouth daily.       Musculoskeletal: Strength & Muscle Tone: within normal limits Gait & Station: normal Patient leans: N/A  Psychiatric Specialty Exam: Physical Exam  Nursing note and vitals reviewed. Constitutional: He appears well-developed and well-nourished.  HENT:  Head: Normocephalic and atraumatic.  Eyes: Pupils are equal, round, and reactive to light. Conjunctivae are normal.  Neck: Normal range of motion.  Cardiovascular: Regular rhythm and normal heart sounds.  Respiratory: Effort normal.  GI: Soft.  Musculoskeletal: Normal range of motion.  Neurological: He is alert.  Skin: Skin is warm and dry.  Psychiatric: He has a normal mood and affect. His speech is normal. Judgment and thought content normal. He is actively  hallucinating. Cognition and memory are normal.    Review of Systems  Constitutional: Negative.   HENT: Negative.   Eyes: Negative.   Respiratory: Negative.   Cardiovascular: Negative.   Gastrointestinal: Negative.   Musculoskeletal: Negative.   Skin: Negative.   Neurological: Negative.   Psychiatric/Behavioral: Positive for hallucinations. Negative for depression, substance abuse and suicidal ideas. The patient is nervous/anxious.     Blood pressure 113/74, pulse 60, temperature (!) 97.5 F (36.4 C), temperature source Oral, resp. rate 18, height 5\' 11"  (1.803 m), weight 102.1 kg, SpO2 99 %.Body mass index is 31.38 kg/m.  General Appearance: Casual  Eye Contact:  Good  Speech:  Clear and Coherent  Volume:  Normal  Mood:  Euthymic  Affect:  Congruent  Thought Process:  Goal Directed  Orientation:  Full (Time, Place, and Person)  Thought Content:  Logical, Hallucinations: Auditory, Ideas of Reference:   Paranoia and Paranoid Ideation  Suicidal Thoughts:  No  Homicidal Thoughts:  No  Memory:  Immediate;   Fair Recent;  Fair Remote;   Fair  Judgement:  Fair  Insight:  Fair  Psychomotor Activity:  Normal  Concentration:  Concentration: Fair  Recall:  [employer] of Knowledge:  Fair  Language:  Fair  Akathisia:  No  Handed:  Right  AIMS (if indicated):     Assets:  Communication Skills Desire for Improvement Financial Resources/Insurance Housing Physical Health Resilience Social Support Vocational/Educational  ADL's:  Intact  Cognition:  WNL  Sleep:  Number of Hours: 7.5    Treatment Plan Summary: Daily contact with patient to assess and evaluate symptoms and progress in treatment, Medication management and Plan Patient with atypical psychotic symptoms.  Does not present a picture of a mood episode.  History over the long-term not consistent with schizophrenia.  No evidence of any specific medical or toxic effect.  Not even really consistent with delusional disorder  in that the presenting symptom is hallucinations rather than delusions.  Patient does not have any obvious neurologic findings but in this situation I think it is appropriate to get an MRI of the brain to rule out any visible organic cause given that this is a very unusual presentation.  Meanwhile I suggest to the patient that we start low-dose antipsychotic starting with Invega.  Side effects reviewed and patient agreeable.  I will try and contact his wife for any other collateral history.  I will review labs to see if there is anything else that has not been done that we need to check on.  Likely length of stay 2 to 3 days.  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    Alethia Berthold, MD 7/7/20202:10 PM

## 2018-10-16 NOTE — Plan of Care (Signed)
Pt denies depression, anxiety, SI, HI but states he has auditory hallucinations that are negative and positive in nature. Pt was educated on care plan and verbalizes understanding. Collier Bullock RN Problem: Activity: Goal: Will verbalize the importance of balancing activity with adequate rest periods Outcome: Progressing   Problem: Education: Goal: Will be free of psychotic symptoms Outcome: Progressing Goal: Knowledge of the prescribed therapeutic regimen will improve Outcome: Progressing   Problem: Coping: Goal: Coping ability will improve Outcome: Progressing Goal: Will verbalize feelings Outcome: Progressing   Problem: Health Behavior/Discharge Planning: Goal: Compliance with prescribed medication regimen will improve Outcome: Progressing   Problem: Nutritional: Goal: Ability to achieve adequate nutritional intake will improve Outcome: Progressing   Problem: Safety: Goal: Ability to redirect hostility and anger into socially appropriate behaviors will improve Outcome: Progressing Goal: Ability to remain free from injury will improve Outcome: Progressing   Problem: Self-Care: Goal: Ability to participate in self-care as condition permits will improve Outcome: Progressing   Problem: Self-Concept: Goal: Will verbalize positive feelings about self Outcome: Progressing   Problem: Education: Goal: Knowledge of [place] [facility] Education information/materials will improve Outcome: Progressing Goal: Emotional status will improve Outcome: Progressing Goal: Mental status will improve Outcome: Progressing Goal: Verbalization of understanding the information provided will improve Outcome: Progressing   Problem: Health Behavior/Discharge Planning: Goal: Identification of resources available to assist in meeting health care needs will improve Outcome: Progressing Goal: Compliance with treatment plan for underlying cause of condition will improve Outcome: Progressing    Problem: Safety: Goal: Periods of time without injury will increase Outcome: Progressing

## 2018-10-16 NOTE — Progress Notes (Signed)
Patient  Slept though the night with out any issues and no complains , requiring only 15 minutes safety checks. No distress noted.

## 2018-10-16 NOTE — [facility] Suicide Risk Assessment (Signed)
[place] INPATIENT:  Family/Significant Other Suicide Prevention Education  Suicide Prevention Education:  Education Completed; Marvis Saefong, wife [phone]  has been identified by the patient as the family member/significant other with whom the patient will be residing, and identified as the person(s) who will aid the patient in the event of a mental health crisis (suicidal ideations/suicide attempt).  With written consent from the patient, the family member/significant other has been provided the following suicide prevention education, prior to the and/or following the discharge of the patient.  The suicide prevention education provided includes the following:  Suicide risk factors  Suicide prevention and interventions  National Suicide Hotline telephone number  [facility] assessment telephone number  [facility] Emergency Assistance [place] and/or Residential Mobile Crisis Unit telephone number  Request made of family/significant other to:  Remove weapons (e.g., guns, rifles, knives), all items previously/currently identified as safety concern.    Remove drugs/medications (over-the-counter, prescriptions, illicit drugs), all items previously/currently identified as a safety concern.  The family member/significant other verbalizes understanding of the suicide prevention education information provided.  The family member/significant other agrees to remove the items of safety concern listed above.  Ms. Furtick reports the pt was brought to the hospital due to experiencing auditory hallucinations. She says the hallucinations have worsened over the past few days. She states the pt reports the voices "are not violent just rude." She states having no reservation about the pt returning home and denies him having access to guns or weapons.   Kashana Breach T Shanah Guimaraes 10/16/2018, 2:06 PM

## 2018-10-17 ENCOUNTER — Inpatient Hospital Stay: Payer: 59

## 2018-10-17 MED ORDER — IOHEXOL 350 MG/ML SOLN
75.0000 mL | Freq: Once | INTRAVENOUS | Status: AC | PRN
  Administered 2018-10-17: 17:00:00 75 mL via INTRAVENOUS

## 2018-10-17 MED ORDER — TRAZODONE HCL 50 MG PO TABS
50.0000 mg | ORAL_TABLET | Freq: Every day | ORAL | Status: DC
  Administered 2018-10-17: 50 mg via ORAL
  Filled 2018-10-17: qty 1

## 2018-10-17 NOTE — Plan of Care (Signed)
D- Patient alert and oriented. Patient presents in a pleasant mood on assessment stating that "I'm a little tired, but I'm doing good". The only complaint patient had was of being cold in his room and this writer adjusted the temperature in his room. Patient denies SI, HI, AVH, and pain at this time, although he stated that his AH is "better today, it comes and goes". Patient also denies any signs/symptoms of depression and anxiety stating "absolutely not".  Patient had no stated goals for today.  A- Support and encouragement provided. Routine safety checks conducted every 15 minutes. Patient informed to notify staff with problems or concerns.  R- Patient contracts for safety at this time. Patient compliant with treatment plan. Patient receptive, calm, and cooperative. Patient interacts well with others on the unit.  Patient remains safe at this time.  Problem: Activity: Goal: Will verbalize the importance of balancing activity with adequate rest periods Outcome: Progressing   Problem: Education: Goal: Will be free of psychotic symptoms Outcome: Progressing Goal: Knowledge of the prescribed therapeutic regimen will improve Outcome: Progressing   Problem: Coping: Goal: Coping ability will improve Outcome: Progressing Goal: Will verbalize feelings Outcome: Progressing   Problem: Health Behavior/Discharge Planning: Goal: Compliance with prescribed medication regimen will improve Outcome: Progressing   Problem: Nutritional: Goal: Ability to achieve adequate nutritional intake will improve Outcome: Progressing   Problem: Safety: Goal: Ability to redirect hostility and anger into socially appropriate behaviors will improve Outcome: Progressing Goal: Ability to remain free from injury will improve Outcome: Progressing   Problem: Self-Care: Goal: Ability to participate in self-care as condition permits will improve Outcome: Progressing   Problem: Self-Concept: Goal: Will verbalize  positive feelings about self Outcome: Progressing   Problem: Education: Goal: Knowledge of [facility] Education information/materials will improve Outcome: Progressing Goal: Emotional status will improve Outcome: Progressing Goal: Mental status will improve Outcome: Progressing Goal: Verbalization of understanding the information provided will improve Outcome: Progressing   Problem: Health Behavior/Discharge Planning: Goal: Identification of resources available to assist in meeting health care needs will improve Outcome: Progressing Goal: Compliance with treatment plan for underlying cause of condition will improve Outcome: Progressing   Problem: Safety: Goal: Periods of time without injury will increase Outcome: Progressing

## 2018-10-17 NOTE — Progress Notes (Signed)
D - Patient was outside upon arrival to the unit with the group. Patient was pleasant during assessment and medication administration. Patient denies SI/HI/AVH, pain, anxiety and depression with this Probation officer. Patient stated, "I had a good day. They told me I would probably only be here a day or two."   A - Patient compliant with medication administration per MD orders and procedures on the unit. Patient was observed by this Probation officer interacting appropriately with staff and peers on the unit. Patient given education. Patient given support and encouragement to be active in his treatment plan. Patient informed to let staff know if there are any issues or problems on the unit.   R - Patient being monitored Q 15 minutes for safety per unit protocol. Patient remains safe on the unit.

## 2018-10-17 NOTE — Tx Team (Addendum)
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 acetaminophen (TYLENOL) tablet 650 mg  650 mg Oral Q6H PRN Patrecia Pour, NP      . alum & mag hydroxide-simeth (MAALOX/MYLANTA) 200-200-20 MG/5ML suspension 30 mL  30 mL Oral Q4H PRN Patrecia Pour, NP      . magnesium hydroxide (MILK OF MAGNESIA) suspension 30 mL  30 mL Oral Daily PRN Patrecia Pour, NP      . paliperidone (INVEGA) 24 hr tablet 6 mg  6 mg Oral QHS Clapacs, Madie Reno, MD   6 mg at 10/16/18 2205   PTA Medications: Medications Prior to Admission  Medication Sig Dispense Refill Last Dose  . acetaminophen (TYLENOL) 325 MG tablet Take 650 mg by mouth every 6 (six) hours as needed.   prn at prn  . Multiple Vitamin (MULTIVITAMIN WITH MINERALS) TABS tablet Take 1 tablet by mouth daily.   Past Month at Unknown time    Patient Stressors: Traumatic event Other: "I just want the voices to stop."  Patient Strengths: Facilities manager fund of knowledge Motivation for treatment/growth Religious Affiliation Supportive family/friends Work skills  Treatment Modalities: Medication Management, Group therapy, Case management,  1 to 1 session with clinician, Psychoeducation, Recreational therapy.   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  Therapeutic Interventions: 1 to 1 sessions, Unit Group sessions and Medication administration.  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  Therapeutic Interventions: 1 to 1 sessions, Unit Group sessions and Medication administration.  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 RN will report any adverse and/or side effects to prescribing provider.  Therapeutic Interventions: 1 on 1 counseling sessions, Psychoeducation, Medication administration, Evaluate responses to treatment, Monitor vital signs and CBGs as ordered,  Perform/monitor CIWA, COWS, AIMS and Fall Risk screenings as ordered, Perform wound care  treatments as ordered.  Evaluation of Outcomes: Progressing   LCSW Treatment Plan for Primary Diagnosis: Atypical psychosis ([place]) Long Term Goal(s): Safe transition to appropriate next level of care at discharge, Engage patient in therapeutic group addressing interpersonal concerns.  Short Term Goals: Engage patient in aftercare planning with referrals and resources, Increase emotional regulation, Facilitate acceptance of mental health diagnosis and concerns and Increase skills for wellness and recovery  Therapeutic Interventions: Assess for all discharge needs, 1 to 1 time with Social worker, Explore available resources and support systems, Assess for adequacy in community support network, Educate family and significant other(s) on suicide prevention, Complete Psychosocial Assessment, Interpersonal group therapy.  Evaluation of Outcomes: Progressing   Progress in Treatment: Attending groups: Yes. Participating in groups: Yes. Taking medication as prescribed: Yes. Toleration medication: Yes. Family/Significant other contact made: Yes, individual(s) contacted:  Pts wife Patient understands diagnosis: Yes. Discussing patient identified problems/goals with staff: Yes. Medical problems stabilized or resolved: Yes. Denies suicidal/homicidal ideation: Yes. Issues/concerns per patient self-inventory: No. Other: N/A  New problem(s) identified: No, Describe:  none  New Short Term/Long Term Goal(s):  elimination of AVH/symptoms of psychosis, medication management for mood stabilization; development of comprehensive mental wellness/sobriety plan.   Patient Goals:  "to get back to normal life and get back to work"  Discharge Plan or Barriers: SPE pamphlet, Mobile Crisis information, and AA/NA information provided to patient for additional community support and resources. Pt declines follow up at this time  and will be given a resource list at discharge.   Reason for Continuation of Hospitalization: Medication stabilization  Estimated Length of Stay: Tomorrow 10/18/2018    Recreational Therapy: Patient: N/A Patient Goal: Patient will engage in groups without prompting or encouragement from LRT x3 group sessions within 5 recreation therapy group sessions  Attendees: Patient: Alcus Bradly 10/17/2018 2:55 PM  Physician: Dr Weber Cooks MD 10/17/2018 2:55 PM  Nursing: Lyda Kalata RN 10/17/2018 2:55 PM  RN Care Manager: 10/17/2018 2:55 PM  Social Worker: Minette Brine Moton LCSW 10/17/2018 2:55 PM  Recreational Therapist: Roanna Epley CTRS LRT 10/17/2018 2:55 PM  Other: Sanjuana Kava LCSW 10/17/2018 2:55 PM  Other: Assunta Curtis LCSW 10/17/2018 2:55 PM  Other: 10/17/2018 2:55 PM    Scribe for Treatment Team: Mariann Laster Moton, LCSW 10/17/2018 2:55 PM

## 2018-10-17 NOTE — Plan of Care (Signed)
Patient compliant with medication administration per MD orders and procedures on the unit.  Problem: Education: Goal: Knowledge of the prescribed therapeutic regimen will improve Outcome: Not Progressing   Problem: Health Behavior/Discharge Planning: Goal: Compliance with prescribed medication regimen will improve Outcome: Not Progressing

## 2018-10-17 NOTE — Progress Notes (Signed)
D - Patient was outside upon arrival to the unit with the group. Patient was pleasant during assessment and medication administration. Patient denies SI/HI/AVH, pain, anxiety and depression with this Probation officer. Patient stated, "I think I am going home tomorrow, I feel like I am ready to leave."  A - Patient compliant with medication administration per MD orders and procedures on the unit. Patient was observed by this Probation officer interacting appropriately with staff and peers on the unit. Patient given education. Patient given support and encouragement to be active in his treatment plan. Patient informed to let staff know if there are any issues or problems on the unit.   R - Patient being monitored Q 15 minutes for safety per unit protocol. Patient remains safe on the unit.

## 2018-10-17 NOTE — [facility] Group Notes (Signed)
LCSW Group Therapy Note  10/17/2018 1:00PM   Type of Therapy/Topic:  Group Therapy:  Emotion Regulation  Participation Level:  Minimal   Description of Group:   The purpose of this group is to assist patients in learning to regulate negative emotions and experience positive emotions. Patients will be guided to discuss ways in which they have been vulnerable to their negative emotions. These vulnerabilities will be juxtaposed with experiences of positive emotions or situations, and patients will be challenged to use positive emotions to combat negative ones. Special emphasis will be placed on coping with negative emotions in conflict situations, and patients will process healthy conflict resolution skills.  Therapeutic Goals: 1. Patient will identify two positive emotions or experiences to reflect on in order to balance out negative emotions 2. Patient will label two or more emotions that they find the most difficult to experience 3. Patient will demonstrate positive conflict resolution skills through discussion and/or role plays  Summary of Patient Progress: Patient arrived to group late. Patient did not contribute to the conversation.   Therapeutic Modalities:   Cognitive Behavioral Therapy Feelings Identification Dialectical Behavioral Therapy  Assunta Curtis, MSW, LCSW 10/17/2018 10:41 AM

## 2018-10-17 NOTE — Progress Notes (Signed)
Recreation Therapy Notes  INPATIENT RECREATION THERAPY ASSESSMENT  Patient Details Name: Eliakim Tendler MRN: [id number] DOB: May 16, 1970 Today's Date: 10/17/2018       Information Obtained From: Patient  Able to Participate in Assessment/Interview: Yes  Patient Presentation: Responsive  Reason for Admission (Per Patient): Active Symptoms  Patient Stressors:    Coping Skills:   Other (Comment)(None)  Leisure Interests (2+):  Music - Listen, Lawyer, Social - Family  Frequency of Recreation/Participation: Biomedical engineer of Community Resources:  Yes  Community Resources:  Gym  Current Use:    If no, Barriers?:    Expressed Interest in [employer] Information:    [employer] of Residence:  Insurance underwriter  Patient Main Form of Transportation: Musician  Patient Strengths:  Caring, loving and helping people  Patient Identified Areas of Improvement:  N/A  Patient Goal for Hospitalization:  To go home  Current SI (including self-harm):  No  Current HI:  No  Current AVH: No  Staff Intervention Plan: Group Attendance, Collaborate with Interdisciplinary Treatment Team  Consent to Intern Participation: N/A  Itzy Adler 10/17/2018, 3:11 PM

## 2018-10-17 NOTE — Plan of Care (Signed)
Patient complaint with medication administration per MD orders. Patient denies AVH with this writer this evening.   Problem: Education: Goal: Will be free of psychotic symptoms Outcome: Progressing Goal: Knowledge of the prescribed therapeutic regimen will improve Outcome: Progressing

## 2018-10-17 NOTE — Progress Notes (Signed)
[facility] MD Progress Note  10/17/2018 4:03 PM Eugene Johnson  MRN:  [id number] Subjective: Follow-up for this 48 year old gentleman with atypical hallucinations.  Patient seen chart reviewed.  Patient came to treatment team today as well.  He tells me that he is feeling better.  He says the hallucinations are still perceptible at times but are much diminished.  He no longer feels the urgency of them that he did before.  Patient denies being depressed denies any suicidal or homicidal ideation.  He is able to get up and attend treatment team and was quite appropriate talking with [place].  No sign of any acute dangerousness.  Took the Invega last night without any notable side effects.  MRI scan results came back and the only abnormality was some discussion of some white matter changes that appear to be nonspecific. 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  1 previous hospitalization and one previous suicide attempt about 9 years ago.  Past Medical History: History reviewed. No pertinent past medical history. History reviewed. No pertinent surgical history. Family History: History reviewed. No pertinent family history. Family Psychiatric  History: Denies any Social History:  Social History   Substance and Sexual Activity  Alcohol Use None     Social History   Substance and Sexual Activity  Drug Use Not on file    Social History   Socioeconomic History  . Marital status: Married    Spouse name: Not on file  . Number of children: Not on file  . Years of education: Not on file  . Highest education level: Not on file  Occupational History  . Not on file  Social Needs  . Financial resource strain: Not on file  . Food insecurity    Worry: Not on file   Inability: Not on file  . Transportation needs    Medical: Not on file    Non-medical: Not on file  Tobacco Use  . Smoking status: Never Smoker  . Smokeless tobacco: Never Used  Substance and Sexual Activity  . Alcohol use: Not on file  . Drug use: Not on file  . Sexual activity: Not on file  Lifestyle  . Physical activity    Days per week: Not on file    Minutes per session: Not on file  . Stress: Not on file  Relationships  . Social Herbalist on phone: Not on file    Gets together: Not on file    Attends religious service: Not on file    Active member of club or organization: Not on file    Attends meetings of clubs or organizations: Not on file    Relationship status: Not on file  Other Topics Concern  . Not on file  Social History Narrative  . Not on file   Additional Social History:    Pain Medications: see PTA Prescriptions: see PTA Over the Counter: see PTA History of alcohol / drug use?: No history of alcohol / drug abuse                    Sleep: Fair  Appetite:  Fair  Current Medications: Current Facility-Administered Medications  Medication Dose Route Frequency Provider Last Rate Last Dose  . acetaminophen (TYLENOL) tablet 650 mg  650 mg Oral Q6H  PRN Patrecia Pour, NP      . alum & mag hydroxide-simeth (MAALOX/MYLANTA) 200-200-20 MG/5ML suspension 30 mL  30 mL Oral Q4H PRN Patrecia Pour, NP      . magnesium hydroxide (MILK OF MAGNESIA) suspension 30 mL  30 mL Oral Daily PRN Patrecia Pour, NP      . paliperidone (INVEGA) 24 hr tablet 6 mg  6 mg Oral QHS Clemence Stillings, Madie Reno, MD   6 mg at 10/16/18 2205    Lab Results:  Results for orders placed or performed during the hospital encounter of 10/15/18 (from the past 48 hour(s))  Lipid panel     Status: Abnormal   Collection Time: 10/16/18  2:42 PM  Result Value Ref Range   Cholesterol 170 0 - 200 mg/dL   Triglycerides 253 (H) <150 mg/dL   HDL 37 (L) >40 mg/dL   Total CHOL/HDL Ratio  4.6 RATIO   VLDL 51 (H) 0 - 40 mg/dL   LDL Cholesterol 82 0 - 99 mg/dL    Comment:        Total Cholesterol/HDL:CHD Risk Coronary Heart Disease Risk Table                     Men   Women  1/2 Average Risk   3.4   3.3  Average Risk       5.0   4.4  2 X Average Risk   9.6   7.1  3 X Average Risk  23.4   11.0        Use the calculated Patient Ratio above and the CHD Risk Table to determine the patient's CHD Risk.        ATP III CLASSIFICATION (LDL):  <100     mg/dL   Optimal  100-129  mg/dL   Near or Above                    Optimal  130-159  mg/dL   Borderline  160-189  mg/dL   High  >190     mg/dL   Very High Performed at [facility], [address] [place], [place] [address]   Hemoglobin A1c     Status: None   Collection Time: 10/16/18  2:42 PM  Result Value Ref Range   Hgb A1c MFr Bld 5.5 4.8 - 5.6 %    Comment: (NOTE) Pre diabetes:          5.7%-6.4% Diabetes:              >6.4% Glycemic control for   <7.0% adults with diabetes    Mean Plasma Glucose 111.15 mg/dL    Comment: Performed at [facility] [address], [place], [place] [address]    Blood Alcohol level:  Lab Results  Component Value Date   ETH <10 03/00/9233    Metabolic Disorder Labs: Lab Results  Component Value Date   HGBA1C 5.5 10/16/2018   MPG 111.15 10/16/2018   No results found for: PROLACTIN Lab Results  Component Value Date   CHOL 170 10/16/2018   TRIG 253 (H) 10/16/2018   HDL 37 (L) 10/16/2018   CHOLHDL 4.6 10/16/2018   VLDL 51 (H) 10/16/2018   LDLCALC 82 10/16/2018    Physical Findings: AIMS: Facial and Oral Movements Muscles of Facial Expression: None, normal Lips and Perioral Area: None, normal Jaw: None, normal Tongue: None, normal,Extremity Movements Upper (arms, wrists, hands, fingers): None, normal Lower (legs, knees,  ankles, toes): None, normal, Trunk Movements Neck, shoulders, hips: None, normal, Overall Severity Severity of abnormal movements  (highest score from questions above): None, normal Incapacitation due to abnormal movements: None, normal Patient's awareness of abnormal movements (rate only patient's report): No Awareness, Dental Status Current problems with teeth and/or dentures?: No Does patient usually wear dentures?: No  CIWA:  CIWA-Ar Total: 0 COWS:  COWS Total Score: 0  Musculoskeletal: Strength & Muscle Tone: within normal limits Gait & Station: normal Patient leans: N/A  Psychiatric Specialty Exam: Physical Exam  Nursing note and vitals reviewed. Constitutional: He appears well-developed and well-nourished.  HENT:  Head: Normocephalic and atraumatic.  Eyes: Pupils are equal, round, and reactive to light. Conjunctivae are normal.  Neck: Normal range of motion.  Cardiovascular: Regular rhythm and normal heart sounds.  Respiratory: Effort normal. No respiratory distress.  GI: Soft.  Musculoskeletal: Normal range of motion.  Neurological: He is alert.  Skin: Skin is warm and dry.  Psychiatric: He has a normal mood and affect. His speech is normal and behavior is normal. Judgment and thought content normal. Cognition and memory are normal.    Review of Systems  Constitutional: Negative.   HENT: Negative.   Eyes: Negative.   Respiratory: Negative.   Cardiovascular: Negative.   Gastrointestinal: Negative.   Musculoskeletal: Negative.   Skin: Negative.   Neurological: Negative.   Psychiatric/Behavioral: Positive for hallucinations. Negative for depression, memory loss, substance abuse and suicidal ideas. The patient is not nervous/anxious and does not have insomnia.     Blood pressure 114/74, pulse (!) 56, temperature 97.6 F (36.4 C), temperature source Oral, resp. rate 18, height 5\' 11"  (1.803 m), weight 102.1 kg, SpO2 99 %.Body mass index is 31.38 kg/m.  General Appearance: Casual  Eye Contact:  Good  Speech:  Clear and Coherent  Volume:  Normal  Mood:  Euthymic  Affect:  Appropriate  Thought  Process:  Goal Directed  Orientation:  Full (Time, Place, and Person)  Thought Content:  Logical and Hallucinations: Auditory  Suicidal Thoughts:  No  Homicidal Thoughts:  No  Memory:  Immediate;   Fair Recent;   Fair Remote;   Fair  Judgement:  Fair  Insight:  Fair  Psychomotor Activity:  Normal  Concentration:  Concentration: Fair  Recall:  [employer] of Knowledge:  Fair  Language:  Fair  Akathisia:  No  Handed:  Right  AIMS (if indicated):     Assets:  Desire for Improvement Housing Physical Health Social Support  ADL's:  Intact  Cognition:  WNL  Sleep:  Number of Hours: 6.5     Treatment Plan Summary: Daily contact with patient to assess and evaluate symptoms and progress in treatment, Medication management and Plan Patient is reporting symptoms are improved.Do sign of dangerousness. I have asked neuro for a consult. Continue current medication and follow up with likely discharge tomorrow  Alethia Berthold, MD 10/17/2018, 4:03 PM

## 2018-10-17 NOTE — Progress Notes (Signed)
Recreation Therapy Notes    Date: 10/17/2018  Time: 9:30 am   Location: Craft room   Behavioral response: N/A   Intervention Topic: Coping Skills  Discussion/Intervention: Patient did not attend group.   Clinical Observations/Feedback:  Patient did not attend group.   Darius Fillingim LRT/CTRS        Toa Mia 10/17/2018 11:45 AM

## 2018-10-17 NOTE — Progress Notes (Signed)
Patient ID: Eugene Johnson, male   DOB: [birth year], 48 y.o.   MRN: [id number] Brief follow-up.  I spoke with Dr.Guerch, neurology consultant on call.  He recommended a CT angiogram and if possible and EEG as follow-up tests for this patient.  I will order them and hope we may be able to get them done prior to discharge.

## 2018-10-18 ENCOUNTER — Other Ambulatory Visit: Payer: Self-pay

## 2018-10-18 MED ORDER — PALIPERIDONE ER 6 MG PO TB24: 6.0000 mg | ORAL_TABLET | Freq: Every day | ORAL | 2 refills | Status: DC

## 2018-10-18 NOTE — Discharge Summary (Signed)
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  Subsequently has tolerated hallucinations without treatment  Past Medical History: History reviewed. No pertinent past medical history. History reviewed. No pertinent surgical history. Family History: History reviewed. No pertinent family history. Family Psychiatric  History: None reported Social History:  Social History   Substance and Sexual Activity  Alcohol Use None     Social History   Substance and Sexual Activity  Drug Use Not on file    Social History   Socioeconomic History  . Marital status: Married    Spouse name: Not on file  . Number of children: Not on file  . Years of education: Not on file  . Highest education level: Not on file  Occupational History  . Not on file  Social Needs  . Financial resource strain: Not on file  . Food insecurity    Worry: Not on file    Inability: Not on file  . Transportation needs    Medical: Not on file    Non-medical: Not on file  Tobacco Use  . Smoking status: Never Smoker  . Smokeless tobacco: Never Used  Substance and Sexual Activity  . Alcohol use: Not on file  . Drug use: Not on file  . Sexual activity: Not on file  Lifestyle  . Physical activity    Days per week: Not on file     Minutes per session: Not on file  . Stress: Not on file  Relationships  . Social Herbalist on phone: Not on file    Gets together: Not on file    Attends religious service: Not on file    Active member of club or organization: Not on file    Attends meetings of clubs or organizations: Not on file    Relationship status: Not on file  Other Topics Concern  . Not on file  Social History Narrative  . Not on file    Hospital Course: Patient admitted to the psychiatric ward.  Evaluation completed.  Therapeutically started on Invega 6 mg in the evening.  Tolerated medicine without any side effects or complaints.  Has reported that hallucinations are greatly decreased and not bothering him.  Denies suicidal or homicidal behavior.  Has participated and interacted appropriately on the unit.  MRI of the brain with and without contrast was done.  Only abnormality was possibly some patchy small white matter changes of unclear significance.  Consultation was placed with neurology who recommended CT angiogram.  This was done and was completely normal.  Patient is being referred to neurology for outpatient follow-up as well as outpatient psychiatric follow-up and given a prescription for the [facility]  Physical Findings: AIMS: Facial and Oral Movements Muscles of Facial Expression: None, normal Lips and Perioral Area: None, normal Jaw: None, normal Tongue: None, normal,Extremity Movements Upper (arms, wrists, hands,  fingers): None, normal Lower (legs, knees, ankles, toes): None, normal, Trunk Movements Neck, shoulders, hips: None, normal, Overall Severity Severity of abnormal movements (highest score from questions above): None, normal Incapacitation due to abnormal movements: None, normal Patient's awareness of abnormal movements (rate only patient's report): No Awareness, Dental Status Current problems with teeth and/or dentures?: No Does patient usually wear dentures?: No  CIWA:  CIWA-Ar  Total: 0 COWS:  COWS Total Score: 0  Musculoskeletal: Strength & Muscle Tone: within normal limits Gait & Station: normal Patient leans: N/A  Psychiatric Specialty Exam: Physical Exam  Nursing note and vitals reviewed. Constitutional: He appears well-developed and well-nourished.  HENT:  Head: Normocephalic and atraumatic.  Eyes: Pupils are equal, round, and reactive to light. Conjunctivae are normal.  Neck: Normal range of motion.  Cardiovascular: Regular rhythm and normal heart sounds.  Respiratory: Effort normal. No respiratory distress.  GI: Soft.  Musculoskeletal: Normal range of motion.  Neurological: He is alert.  Skin: Skin is warm and dry.  Psychiatric: He has a normal mood and affect. His speech is normal and behavior is normal. Judgment and thought content normal. Cognition and memory are normal.    Review of Systems  Constitutional: Negative.   HENT: Negative.   Eyes: Negative.   Respiratory: Negative.   Cardiovascular: Negative.   Gastrointestinal: Negative.   Musculoskeletal: Negative.   Skin: Negative.   Neurological: Negative.   Psychiatric/Behavioral: Positive for hallucinations. Negative for depression, memory loss, substance abuse and suicidal ideas. The patient is not nervous/anxious and does not have insomnia.     Blood pressure 113/71, pulse (!) 53, temperature (!) 97.4 F (36.3 C), temperature source Oral, resp. rate 19, height 5\' 11"  (1.803 m), weight 102.1 kg, SpO2 99 %.Body mass index is 31.38 kg/m.  General Appearance: Casual  Eye Contact:  Good  Speech:  Clear and Coherent  Volume:  Normal  Mood:  Euthymic  Affect:  Congruent  Thought Process:  Goal Directed  Orientation:  Full (Time, Place, and Person)  Thought Content:  Logical  Suicidal Thoughts:  No  Homicidal Thoughts:  No  Memory:  Immediate;   Fair Recent;   Fair Remote;   Fair  Judgement:  Fair  Insight:  Fair  Psychomotor Activity:  Normal  Concentration:  Concentration:  Fair  Recall:  [place] of Knowledge:  Fair  Language:  Fair  Akathisia:  No  Handed:  Right  AIMS (if indicated):     Assets:  Communication Skills Desire for Improvement Financial Resources/Insurance [place] Talents/Skills  ADL's:  Intact  Cognition:  WNL  Sleep:  Number of Hours: 7.5     Have you used any form of tobacco in the last 30 days? (Cigarettes, Smokeless Tobacco, Cigars, and/or Pipes): No  Has this patient used any form of tobacco in the last 30 days? (Cigarettes, Smokeless Tobacco, Cigars, and/or Pipes) Yes, No  Blood Alcohol level:  Lab Results  Component Value Date   ETH <10 65/06/5463    Metabolic Disorder Labs:  Lab Results  Component Value Date   HGBA1C 5.5 10/16/2018   MPG 111.15 10/16/2018   No results found for: PROLACTIN Lab Results  Component Value Date   CHOL 170 10/16/2018   TRIG 253 (H) 10/16/2018   HDL 37 (L) 10/16/2018   CHOLHDL 4.6 10/16/2018   VLDL 51 (H) 10/16/2018   [place] 82 10/16/2018    See Psychiatric Specialty Exam and Suicide Risk Assessment completed by Attending Physician  prior to discharge.  Discharge destination:  Home  Is patient on multiple antipsychotic therapies at discharge:  No   Has Patient had three or more failed trials of antipsychotic monotherapy by history:  No  Recommended Plan for Multiple Antipsychotic Therapies: NA  Discharge Instructions    Diet - low sodium heart healthy   Complete by: As directed    Increase activity slowly   Complete by: As directed      Allergies as of 10/18/2018   No Known Allergies     Medication List    STOP taking these medications   acetaminophen 325 MG tablet Commonly known as: TYLENOL   multivitamin with minerals Tabs tablet     TAKE these medications     Indication  paliperidone 6 MG 24 hr tablet Commonly known as: INVEGA Take 1 tablet (6 mg total) by mouth at bedtime.  Indication: Atypical psychosis       Follow-up Information    [facility] up on 10/29/2018.   Why: Please follow up with [facility] on 10/29/18 at 2:20pm. Take you ID, insurace card and discharge paperwork with you. A 24 hour notice is required for rescheduling. Contact information: [place] [address] [phone]        [place] Follow up on 10/22/2018.   Why: Please follow up with [facility] on Monday, July 13th at 3:15pm. Please take your hospital discharge paperwork and insurance card with you to your appointment.  Thank you. Contact information: [place] [address] [phone]           Follow-up recommendations:  Activity:  Activity as tolerated Diet:  Regular diet Other:  Follow-up with outpatient treatment as prescribed including following up with neurology clinic  Comments: Patient educated about medication side effects and treatment of psychiatric illness.  Patient is to return for further help if symptoms dramatically worsen or is there is any concern about dangerousness.  Signed: Alethia Berthold, MD 10/18/2018, 10:16 AM

## 2018-10-18 NOTE — Progress Notes (Signed)
Recreation Therapy Notes   Date: 10/18/2018  Time: 9:30 am   Location: Craft room   Behavioral response: N/A   Intervention Topic: Team Work  Discussion/Intervention: Patient did not attend group.   Clinical Observations/Feedback:  Patient did not attend group.   Mahmoud Blazejewski LRT/CTRS         Aniah Pauli 10/18/2018 10:28 AM

## 2018-10-18 NOTE — BHH Suicide Risk Assessment (Signed)
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   HENT: Negative.   Eyes: Negative.   Respiratory: Negative.   Cardiovascular: Negative.   Gastrointestinal: Negative.   Musculoskeletal: Negative.   Skin: Negative.   Neurological: Negative.   Psychiatric/Behavioral: Positive for hallucinations. Negative for depression, memory loss, substance abuse and suicidal ideas. The patient is not nervous/anxious and does not have insomnia.     Blood pressure 113/71, pulse (!) 53, temperature (!) 97.4 F (36.3 C), temperature source Oral, resp. rate 19, height 5\' 11"  (1.803 m), weight 102.1 kg, SpO2 99 %.Body mass index is 31.38 kg/m.  General Appearance: Casual  Eye Contact::  Good  Speech:  Clear and Coherent409  Volume:  Normal  Mood:  Euthymic  Affect:  Congruent  Thought Process:  Goal Directed  Orientation:  Full (Time, Place, and Person)  Thought Content:  Logical and Hallucinations: Auditory  Suicidal Thoughts:  No  Homicidal Thoughts:  No  Memory:  Immediate;   Fair Recent;   Fair Remote;   Fair  Judgement:  Fair  Insight:  Fair  Psychomotor Activity:  Normal  Concentration:  Fair  Recall:  [employer] of Knowledge:Fair  Language: Fair  Akathisia:  Negative  Handed:  Right  AIMS (if indicated):     Assets:  Desire for Improvement Financial Resources/Insurance Housing Physical Health Social Support  Sleep:  Number of Hours: 7.5  Cognition: WNL  ADL's:  Intact   Mental Status Per Nursing Assessment::   On Admission:  NA  Demographic Factors:  Male  Loss Factors: NA  Historical  Factors: NA  Risk Reduction Factors:   Responsible for children under 66 years of age, Sense of responsibility to family, Religious beliefs about death, Employed, Living with another person, especially a relative, Positive social support and Positive therapeutic relationship  Continued Clinical Symptoms:  Currently Psychotic  Cognitive Features That Contribute To Risk:  None    Suicide Risk:  Minimal: No identifiable suicidal ideation.  Patients presenting with no risk factors but with morbid ruminations; may be classified as minimal risk based on the severity of the depressive symptoms  Follow-up Information    [facility] Follow up on 10/29/2018.   Why: Please follow up with [facility] on 10/29/18 at 2:20pm. Take you ID, insurace card and discharge paperwork with you. A 24 hour notice is required for rescheduling. Contact information: [place] [address] [phone]        [place] Follow up on 10/22/2018.   Why: Please follow up with [facility] on Monday, July 13th at 3:15pm. Please take your hospital discharge paperwork and insurance card with you to your appointment.  Thank you. Contact information: [place] [address] [phone]           Plan Of Care/Follow-up recommendations:  Activity:  Activity as tolerated Diet:  Regular diet Other:  Patient is calm and completely denies any suicidal ideation.  Shows good insight.  He agrees to outpatient treatment  Alethia Berthold, MD 10/18/2018, 10:12 AM

## 2018-10-18 NOTE — Progress Notes (Signed)
  [facility] Adult Case Management Discharge Plan :  Will you be returning to the same living situation after discharge:  Yes,  lives with wife and children At discharge, do you have transportation home?: Yes,  wife will provide transportation Do you have the ability to pay for your medications: Yes,  insurance  Release of information consent forms completed and in the chart;  Patient's signature needed at discharge.  Patient to Follow up at: Follow-up Information    [facility] Follow up on 10/29/2018.   Why: Please follow up with [facility] on 10/29/18 at 2:20pm. Take you ID, insurace card and discharge paperwork with you. A 24 hour notice is required for rescheduling. Contact information: [place] [address] [phone]        [place] Follow up on 10/22/2018.   Why: Please follow up with [facility] on Monday, July 13th at 3:15pm. Please take your hospital discharge paperwork and insurance card with you to your appointment.  Thank you. Contact information: [place] [address] [phone]           Next level of care provider has access to [place] and Suicide Prevention discussed: Yes,  Runell Gess, wife  Have you used any form of tobacco in the last 30 days? (Cigarettes, Smokeless Tobacco, Cigars, and/or Pipes): No  Has patient been referred to the Quitline?: N/A patient is not a smoker  Patient has been referred for addiction treatment: [place], LCSW 10/18/2018, 9:23 AM

## 2018-10-18 NOTE — Progress Notes (Signed)
Patient denies SI/HI, denies A/V hallucinations. Patient verbalizes understanding of discharge instructions, follow up care and prescriptions. Patient given all belongings from [facility] locker. Patient escorted out by staff, transported by family. 

## 2018-10-18 NOTE — Progress Notes (Signed)
Recreation Therapy Notes  INPATIENT RECREATION TR PLAN  Patient Details Name: Vishnu Moeller MRN: [id number] DOB: 1971/01/20 Today's Date: 10/18/2018  Rec Therapy Plan Is patient appropriate for Therapeutic Recreation?: Yes Treatment times per week: At least 3 Estimated Length of Stay: 5-7 days TR Treatment/Interventions: Group participation (Comment)  Discharge Criteria Pt will be discharged from therapy if:: Discharged Treatment plan/goals/alternatives discussed and agreed upon by:: Patient/family  Discharge Summary Short term goals set: Patient will engage in groups without prompting or encouragement from LRT x3 group sessions within 5 recreation therapy group sessions Short term goals met: Not met Reason goals not met: Patient did not attend any groups Therapeutic equipment acquired: N/A Reason patient discharged from therapy: Discharge from hospital Pt/family agrees with progress & goals achieved: Yes Date patient discharged from therapy: 10/18/18   Da Michelle 10/18/2018, 11:40 AM

## 2019-01-06 ENCOUNTER — Other Ambulatory Visit: Payer: Self-pay | Admitting: Psychiatry

## 2019-07-06 ENCOUNTER — Ambulatory Visit: Payer: Self-pay | Attending: Internal Medicine

## 2019-07-06 DIAGNOSIS — Z23 Encounter for immunization: Secondary | ICD-10-CM

## 2019-07-06 NOTE — Progress Notes (Signed)
   Covid-19 Vaccination Clinic  Name:  Eugene Johnson    MRN: [id number] DOB: [birth year]  07/06/2019  [name] was observed post Covid-19 immunization for 15 minutes without incident. He was provided with Vaccine Information Sheet and instruction to access the V-Safe system.   [name] was instructed to call 911 with any severe reactions post vaccine: [name] Kitchen Difficulty breathing  . Swelling of face and throat  . A fast heartbeat  . A bad rash all over body  . Dizziness and weakness   Immunizations Administered    Name Date Dose VIS Date Route   Pfizer COVID-19 Vaccine 07/06/2019  2:07 PM 0.3 mL 03/22/2019 Intramuscular   Manufacturer: [place]   Lot: [id number]   [place]: [id number]

## 2019-07-27 ENCOUNTER — Ambulatory Visit: Payer: Self-pay | Attending: Internal Medicine

## 2019-07-27 DIAGNOSIS — Z23 Encounter for immunization: Secondary | ICD-10-CM

## 2019-07-27 NOTE — Progress Notes (Signed)
   Covid-19 Vaccination Clinic  Name:  Eugene Johnson    MRN: [id number] DOB: 04-03-1971  07/27/2019  Mr. Medaglia was observed post Covid-19 immunization for 15 minutes without incident. He was provided with Vaccine Information Sheet and instruction to access the V-Safe system.   Mr. Dufour was instructed to call 911 with any severe reactions post vaccine: [name] Kitchen Difficulty breathing  . Swelling of face and throat  . A fast heartbeat  . A bad rash all over body  . Dizziness and weakness   Immunizations Administered    Name Date Dose VIS Date Route   Pfizer COVID-19 Vaccine 07/27/2019 12:38 PM 0.3 mL 03/22/2019 Intramuscular   Manufacturer: [place]   Lot: [id number]   [place]: [id number]

## 2020-02-20 IMAGING — CT CT ANGIOGRAPHY HEAD
3 of 10 series · 16 of 47 positions shown · IV contrast (omnipaque)
Comparison: MRI 10/16/2018

CLINICAL DATA: Auditory hallucinations.  White matter foci by MRI.

EXAM:
CT ANGIOGRAPHY HEAD
TECHNIQUE: Multidetector CT imaging of the head was performed using the
standard protocol during bolus administration of intravenous
contrast. Multiplanar CT image reconstructions and MIPs were
obtained to evaluate the vascular anatomy.
CONTRAST:  75mL OMNIPAQUE IOHEXOL 350 MG/ML SOLN

[Series 10: ax thin · axial · 0.38mm/px · z∈[+241,+384]mm · 10 of 166 slices shown]
[im 12/166  brain]
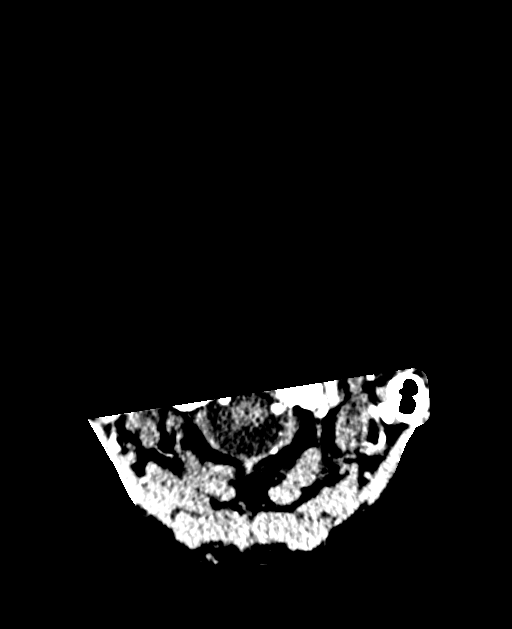
[im 34/166  bone]
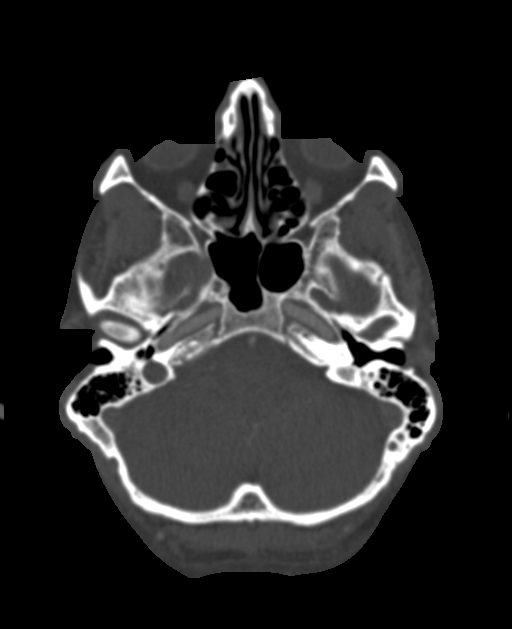
[im 45/166  brain]
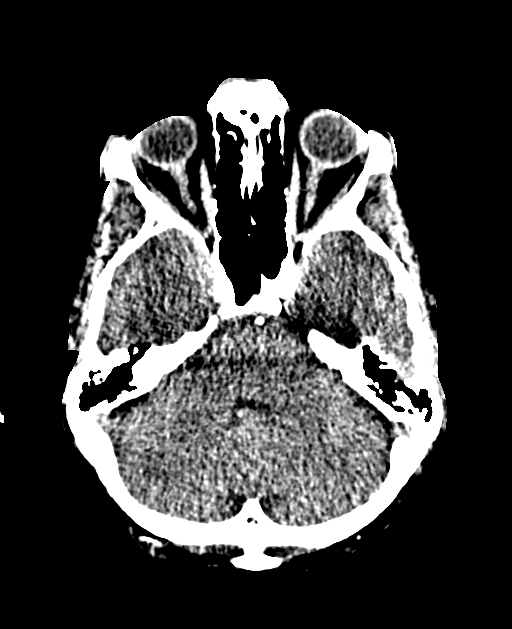
[im 56/166  bone]
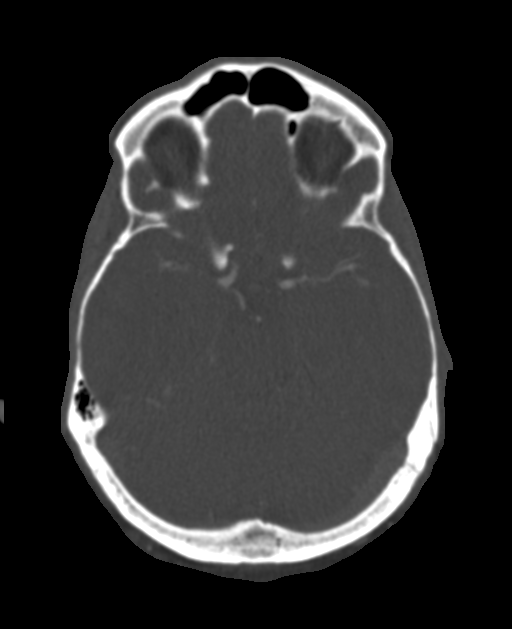
[im 78/166  brain]
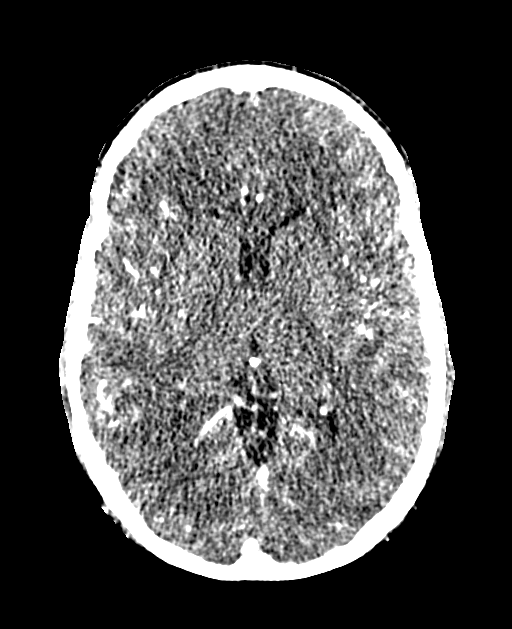
[im 89/166  bone]
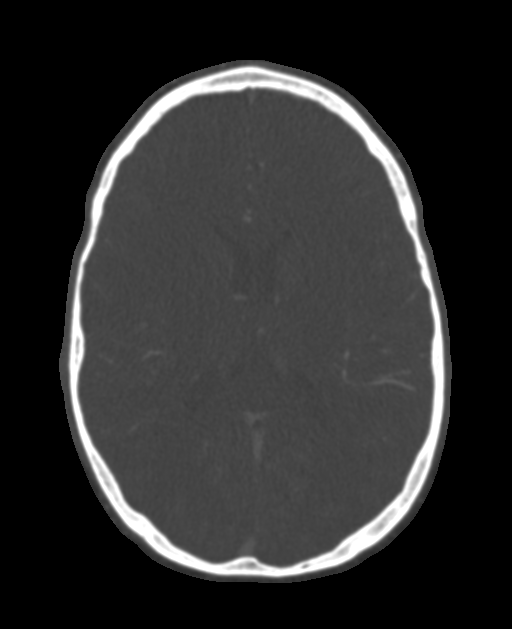
[im 111/166  brain]
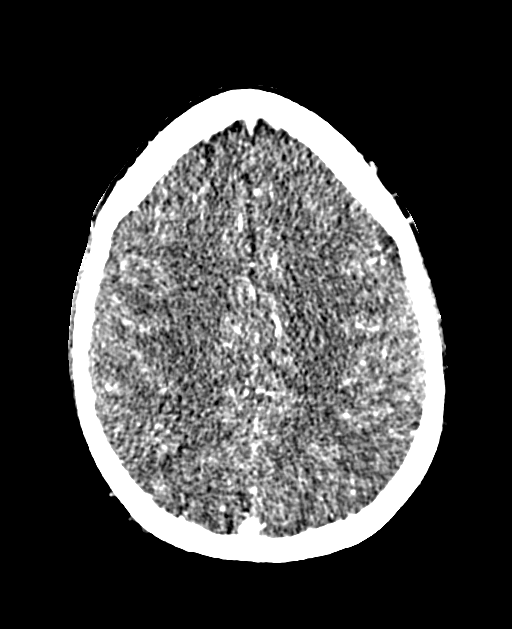
[im 122/166  bone]
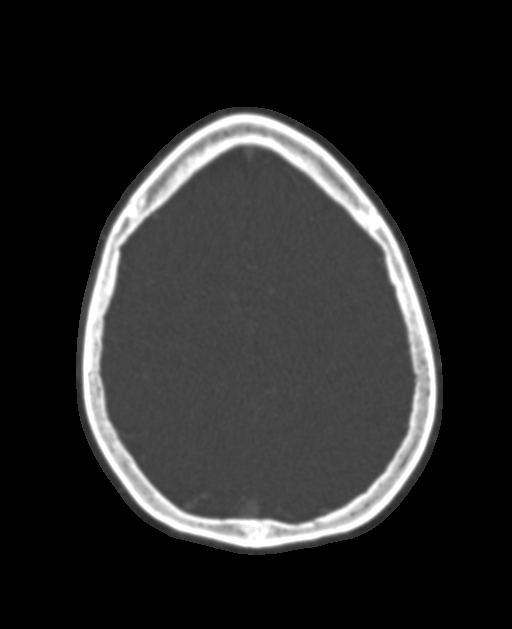
[im 133/166  brain]
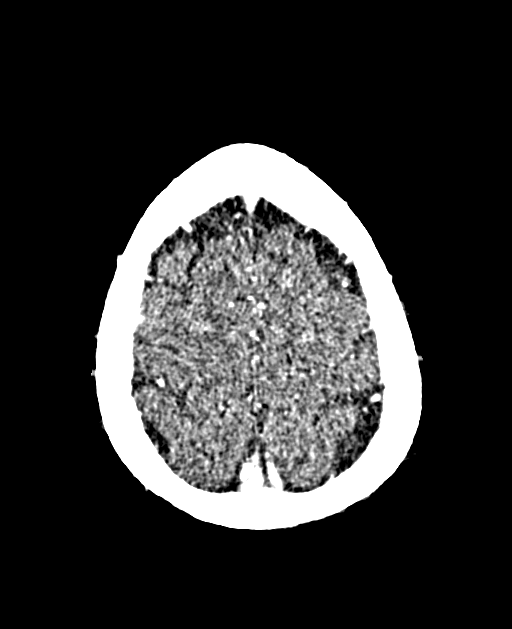
[im 155/166  bone]
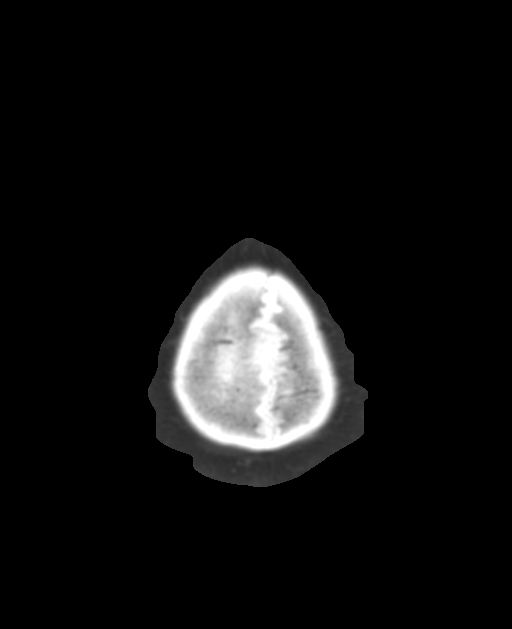

[Series 12: cor thin · coronal · 0.35mm/px · 3 of 219 slices shown]
[im 73/219  brain]
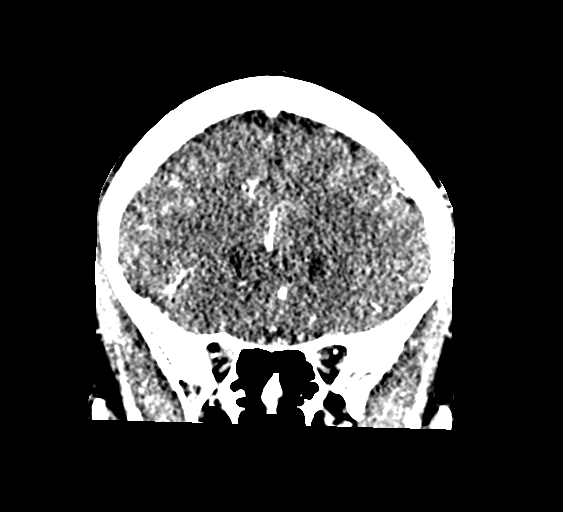
[im 110/219  brain]
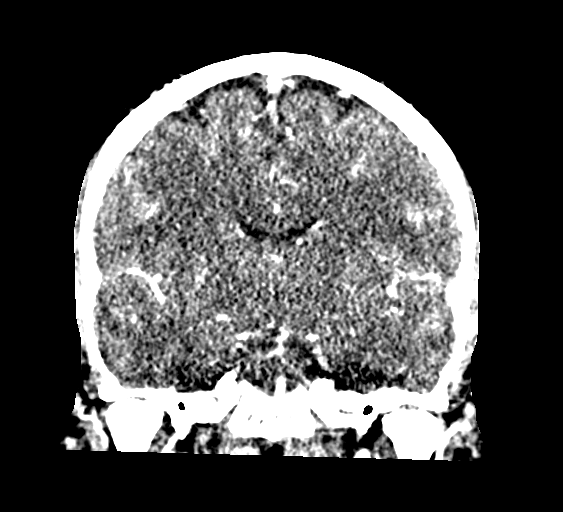
[im 146/219  brain]
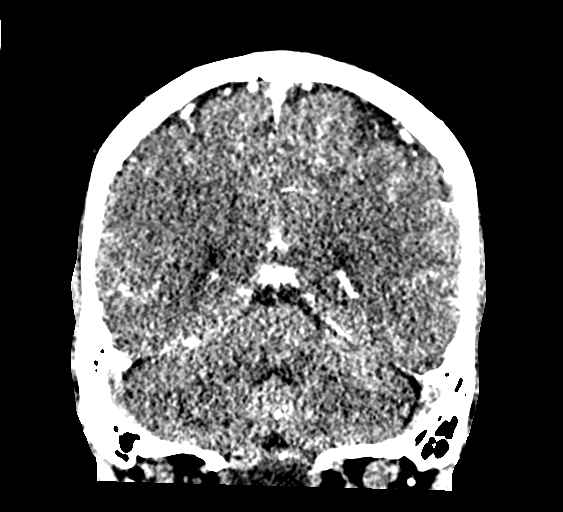

[Series 14: sag thin · sagittal · 0.37mm/px · 3 of 161 slices shown]
[im 41/161  brain]
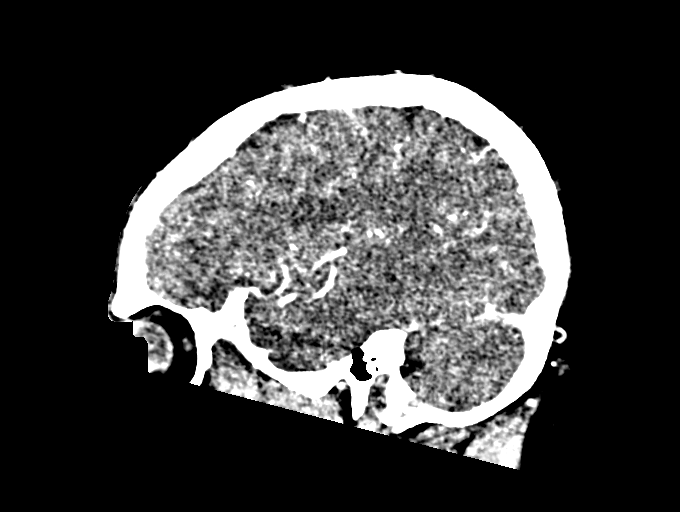
[im 81/161  brain]
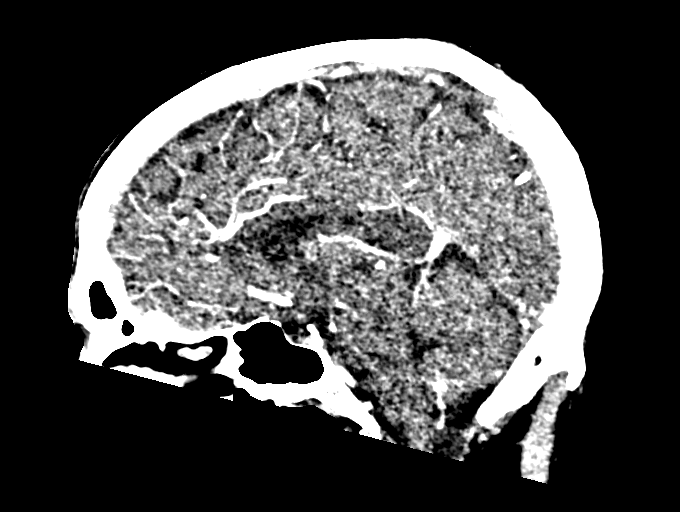
[im 121/161  brain]
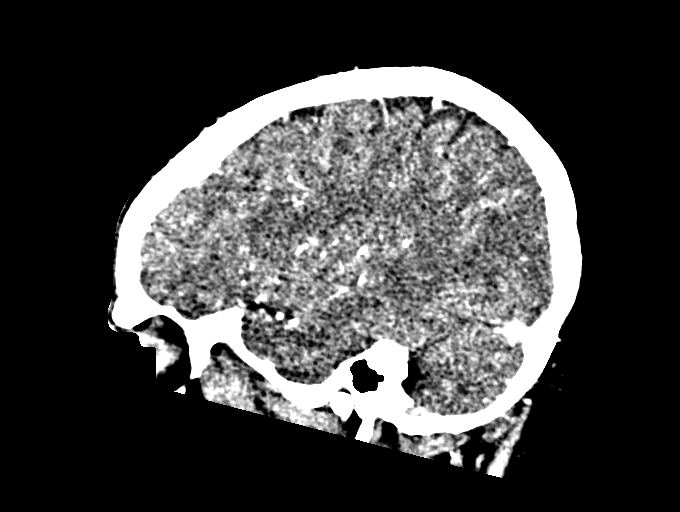

[16 of 47 positions shown; findings below may reference images not displayed]

FINDINGS: CT HEAD

Brain: The brain shows a normal appearance without evidence of
malformation, atrophy, old or acute small or large vessel
infarction, mass lesion, hemorrhage, hydrocephalus or extra-axial
collection.

Vascular: No hyperdense vessel. No evidence of atherosclerotic
calcification.

Skull: Normal.  No traumatic finding.  No focal bone lesion.

Sinuses/Orbits: Sinuses are clear. Orbits appear normal. Mastoids
are clear.

Other: None significant

CTA HEAD

Anterior circulation: Both internal carotid arteries are patent at
the skull base and siphon regions. The anterior and middle cerebral
vessels are normal without proximal stenosis, aneurysm or vascular
malformation. No distal vessel abnormality is seen.

Posterior circulation: Both vertebral arteries widely patent to the
basilar. No basilar stenosis. Posterior circulation branch vessels
appear normal. Prominent posterior communicating artery on the
right.

Venous sinuses: Patent and normal.

Anatomic variants: None

Delayed phase: Not performed.
IMPRESSION: Head CT: Normal.

Intracranial CT angiography: Normal.

## 2020-08-01 ENCOUNTER — Other Ambulatory Visit: Payer: Self-pay

## 2020-08-01 ENCOUNTER — Ambulatory Visit
Admission: EM | Admit: 2020-08-01 | Discharge: 2020-08-01 | Disposition: A | Discharge status: Home / Self Care | Payer: Managed Care, Other (non HMO) | Attending: Emergency Medicine | Admitting: Emergency Medicine

## 2020-08-01 DIAGNOSIS — K0889 Other specified disorders of teeth and supporting structures: Secondary | ICD-10-CM

## 2020-08-01 DIAGNOSIS — K047 Periapical abscess without sinus: Secondary | ICD-10-CM | POA: Diagnosis not present

## 2020-08-01 MED ORDER — AMOXICILLIN-POT CLAVULANATE 875-125 MG PO TABS: 1.0000 | ORAL_TABLET | Freq: Two times a day (BID) | ORAL | 0 refills | Status: AC

## 2020-08-01 MED ORDER — IBUPROFEN 600 MG PO TABS: 600.0000 mg | ORAL_TABLET | Freq: Four times a day (QID) | ORAL | 0 refills | Status: AC | PRN

## 2020-08-01 MED ORDER — HYDROCODONE-ACETAMINOPHEN 5-325 MG PO TABS: 1.0000 | ORAL_TABLET | Freq: Four times a day (QID) | ORAL | 0 refills | Status: AC | PRN

## 2020-08-01 MED ORDER — CHLORHEXIDINE GLUCONATE 0.12 % MT SOLN: OROMUCOSAL | 0 refills | Status: AC

## 2020-08-01 NOTE — ED Triage Notes (Signed)
Pt c/o possible cracked tooth on the bottom right side since last week. Pt has attempted to reach a dentist, but they are not open. Pt is asking for abx and pain meds. Pt reports it feels as if the glands under his right jaw are swollen. Pt denies f/n/v/d or other symptoms.

## 2020-08-01 NOTE — ED Provider Notes (Signed)
HPI  SUBJECTIVE:  Eugene Johnson is a 50 y.o. male who presents with a week of right lower dental pain after cracking his tooth on something.  He describes the pain as throbbing, constant, waxing and waning.  Reports tender swelling underneath his right jaw.  He has follow-up with a dentist on Monday, but states that he was told to come in to be started on antibiotics and pain medications.  No facial swelling, swelling underneath his tongue, fevers, drooling, trismus, sore throat, sensation of throat swelling shut.  He has been taking 1000 milligrams of ibuprofen and Aleve with very mild, temporary improvement in his symptoms.  He tried Listerine with worsening of his pain.  His pain is also worse with eating, drinking and with exposure to air.  Past medical history negative for chronic kidney disease, diabetes, hypertension, smoking.  PMD: [facility] primary care dentist: Ambulance person.    History reviewed. No pertinent past medical history.  History reviewed. No pertinent surgical history.  History reviewed. No pertinent family history.  Social History   Tobacco Use  . Smoking status: Never Smoker  . Smokeless tobacco: Never Used  Vaping Use  . Vaping Use: Some days  Substance Use Topics  . Alcohol use: Not Currently  . Drug use: Never    No current facility-administered medications for this encounter.  Current Outpatient Medications:  .  amoxicillin-clavulanate (AUGMENTIN) 875-125 MG tablet, Take 1 tablet by mouth 2 (two) times daily for 7 days., Disp: 14 tablet, Rfl: 0 .  chlorhexidine (PERIDEX) 0.12 % solution, 15 mL swish and spit bid, Disp: 480 mL, Rfl: 0 .  HYDROcodone-acetaminophen (NORCO/VICODIN) 5-325 MG tablet, Take 1-2 tablets by mouth every 6 (six) hours as needed for moderate pain or severe pain., Disp: 12 tablet, Rfl: 0 .  ibuprofen (ADVIL) 600 MG tablet, Take 1 tablet (600 mg total) by mouth every 6 (six) hours as needed., Disp: 30 tablet, Rfl: 0 .  paliperidone (INVEGA)  6 MG 24 hr tablet, TAKE 1 TABLET BY MOUTH AT BEDTIME, Disp: 30 tablet, Rfl: 2  No Known Allergies   ROS  As noted in HPI.   Physical Exam  BP 125/86 (BP Location: Left Arm)   Pulse 68   Temp 98.2 F (36.8 C) (Oral)   Resp 18   Ht 6' (1.829 m)   Wt 117.9 kg   SpO2 100%   BMI 35.26 kg/m   Constitutional: Well developed, well nourished, no acute distress Eyes:  EOMI, conjunctiva normal bilaterally HENT: Normocephalic, atraumatic,mucus membranes moist tooth #30 right lower first molar broken, decayed, tender to palpation.  Positive tender gingival swelling.  No expressible purulent drainage.  No swelling under the tongue.  Tender mass underneath the jaw.  Mild swelling.  No drooling, trismus. Respiratory: Normal inspiratory effort Cardiovascular: Normal rate GI: nondistended skin: No rash, skin intact Musculoskeletal: no deformities Neurologic: Alert & oriented x 3, no focal neuro deficits Psychiatric: Speech and behavior appropriate   ED Course   Medications - No data to display  No orders of the defined types were placed in this encounter.   No results found for this or any previous visit (from the past 24 hour(s)). No results found.  ED Clinical Impression  1. Dental infection   2. Pain, dental      ED Assessment/Plan  [facility] Narcotic database reviewed for this patient, and feel that the risk/benefit ratio today is favorable for proceeding with a prescription for controlled substance.  No opiate prescriptions in 2 years.  Patient with a dental infection.  No evidence of [place]wick's angina today.  He declined a dental block.  Home with Peridex, Augmentin, ibuprofen and a Tylenol containing product 3-4 times a day as needed for pain.  Patient has follow-up on Monday.  Discussed lMDM, treatment plan, and plan for follow-up with patient. Discussed sn/sx that should prompt return to the ED. patient agrees with plan.   Meds ordered this encounter  Medications  .  chlorhexidine (PERIDEX) 0.12 % solution    Sig: 15 mL swish and spit bid    Dispense:  480 mL    Refill:  0  . HYDROcodone-acetaminophen (NORCO/VICODIN) 5-325 MG tablet    Sig: Take 1-2 tablets by mouth every 6 (six) hours as needed for moderate pain or severe pain.    Dispense:  12 tablet    Refill:  0  . amoxicillin-clavulanate (AUGMENTIN) 875-125 MG tablet    Sig: Take 1 tablet by mouth 2 (two) times daily for 7 days.    Dispense:  14 tablet    Refill:  0  . ibuprofen (ADVIL) 600 MG tablet    Sig: Take 1 tablet (600 mg total) by mouth every 6 (six) hours as needed.    Dispense:  30 tablet    Refill:  0      *This clinic note was created using [employer]. Therefore, there may be occasional mistakes despite careful proofreading.  ?    Melynda Ripple, MD 08/01/20 1759

## 2020-08-01 NOTE — Discharge Instructions (Addendum)
Finish the Augmentin, even if you feel better.  Warm salt water rinses as often as you want and Peridex as written.  Peridex is an antibacterial mouthwash.  Take 600 mg of ibuprofen with a Tylenol containing product 3 or 4 times a day as needed for pain.  Either the ibuprofen with 1000 mg of plain Tylenol for mild to moderate pain or ibuprofen with 1-2 Norco for severe pain.  Do not take the Tylenol and Norco at the same time as they both of Tylenol in them and too much Tylenol can hurt your liver.

## 2021-04-29 ENCOUNTER — Encounter: Payer: Self-pay | Admitting: *Deleted

## 2021-04-30 ENCOUNTER — Ambulatory Visit: Payer: Self-pay | Admitting: Certified Registered Nurse Anesthetist

## 2021-04-30 ENCOUNTER — Encounter
Admission: RE | Disposition: A | Discharge status: Home / Self Care | Payer: Self-pay | Source: Home / Self Care | Attending: Gastroenterology

## 2021-04-30 ENCOUNTER — Ambulatory Visit
Admission: RE | Admit: 2021-04-30 | Discharge: 2021-04-30 | Disposition: A | Discharge status: Home / Self Care | Payer: Self-pay | Attending: Gastroenterology | Admitting: Gastroenterology

## 2021-04-30 ENCOUNTER — Other Ambulatory Visit: Payer: Self-pay

## 2021-04-30 DIAGNOSIS — F419 Anxiety disorder, unspecified: Secondary | ICD-10-CM | POA: Insufficient documentation

## 2021-04-30 DIAGNOSIS — D123 Benign neoplasm of transverse colon: Secondary | ICD-10-CM | POA: Insufficient documentation

## 2021-04-30 DIAGNOSIS — K2950 Unspecified chronic gastritis without bleeding: Secondary | ICD-10-CM | POA: Insufficient documentation

## 2021-04-30 DIAGNOSIS — K640 First degree hemorrhoids: Secondary | ICD-10-CM | POA: Insufficient documentation

## 2021-04-30 DIAGNOSIS — D122 Benign neoplasm of ascending colon: Secondary | ICD-10-CM | POA: Insufficient documentation

## 2021-04-30 DIAGNOSIS — F28 Other psychotic disorder not due to a substance or known physiological condition: Secondary | ICD-10-CM | POA: Insufficient documentation

## 2021-04-30 DIAGNOSIS — K3189 Other diseases of stomach and duodenum: Secondary | ICD-10-CM | POA: Insufficient documentation

## 2021-04-30 DIAGNOSIS — K219 Gastro-esophageal reflux disease without esophagitis: Secondary | ICD-10-CM | POA: Insufficient documentation

## 2021-04-30 DIAGNOSIS — Z1211 Encounter for screening for malignant neoplasm of colon: Secondary | ICD-10-CM | POA: Insufficient documentation

## 2021-04-30 DIAGNOSIS — K621 Rectal polyp: Secondary | ICD-10-CM | POA: Insufficient documentation

## 2021-04-30 HISTORY — DX: Unspecified psychosis not due to a substance or known physiological condition: F29

## 2021-04-30 HISTORY — PX: ESOPHAGOGASTRODUODENOSCOPY (EGD) WITH PROPOFOL: SHX5813

## 2021-04-30 HISTORY — DX: Anxiety disorder, unspecified: F41.9

## 2021-04-30 HISTORY — DX: Gastro-esophageal reflux disease without esophagitis: K21.9

## 2021-04-30 HISTORY — PX: COLONOSCOPY: SHX5424

## 2021-04-30 SURGERY — COLONOSCOPY
Anesthesia: General

## 2021-04-30 MED ORDER — SODIUM CHLORIDE 0.9 % IV SOLN: INTRAVENOUS | Status: DC

## 2021-04-30 MED ORDER — PROPOFOL 500 MG/50ML IV EMUL
INTRAVENOUS | Status: AC
  Filled 2021-04-30: qty 50

## 2021-04-30 MED ORDER — DEXMEDETOMIDINE (PRECEDEX) IN NS 20 MCG/5ML (4 MCG/ML) IV SYRINGE
PREFILLED_SYRINGE | INTRAVENOUS | Status: DC | PRN
  Administered 2021-04-30 (×2): 8 ug via INTRAVENOUS
  Administered 2021-04-30: 4 ug via INTRAVENOUS

## 2021-04-30 MED ORDER — LIDOCAINE HCL (CARDIAC) PF 100 MG/5ML IV SOSY
PREFILLED_SYRINGE | INTRAVENOUS | Status: DC | PRN
Start: 2021-04-30 — End: 2021-04-30
  Administered 2021-04-30: 100 mg via INTRAVENOUS

## 2021-04-30 MED ORDER — PROPOFOL 500 MG/50ML IV EMUL
INTRAVENOUS | Status: DC | PRN
  Administered 2021-04-30: 150 ug/kg/min via INTRAVENOUS

## 2021-04-30 MED ORDER — PROPOFOL 10 MG/ML IV BOLUS
INTRAVENOUS | Status: DC | PRN
  Administered 2021-04-30: 100 mg via INTRAVENOUS
  Administered 2021-04-30: 40 mg via INTRAVENOUS
  Administered 2021-04-30: 30 mg via INTRAVENOUS

## 2021-04-30 MED ORDER — GLYCOPYRROLATE 0.2 MG/ML IJ SOLN
INTRAMUSCULAR | Status: DC | PRN
  Administered 2021-04-30: .2 mg via INTRAVENOUS

## 2021-04-30 NOTE — Transfer of Care (Signed)
Immediate Anesthesia Transfer of Care Note  Patient: Eugene Johnson  Procedure(s) Performed: COLONOSCOPY ESOPHAGOGASTRODUODENOSCOPY (EGD) WITH PROPOFOL  Patient Location: Endoscopy Unit  Anesthesia Type:General  Level of Consciousness: sedated  Airway & Oxygen Therapy: Patient Spontanous Breathing  Post-op Assessment: Report given to RN and Post -op Vital signs reviewed and stable  Post vital signs: Reviewed and stable  Last Vitals:  Vitals Value Taken Time  BP 108/75 04/30/21 1317  Temp    Pulse 68 04/30/21 1317  Resp 20 04/30/21 1317  SpO2 95 % 04/30/21 1317  Vitals shown include unvalidated device data.  Last Pain:  Vitals:   04/30/21 1137  TempSrc: Temporal  PainSc: 0-No pain         Complications: No notable events documented.

## 2021-04-30 NOTE — Op Note (Signed)
[facility] Gastroenterology Patient Name: Eugene Johnson Procedure Date: 04/30/2021 12:26 PM MRN: [id number] Account #: [id number] Date of Birth: 1971-02-07 Admit Type: Outpatient Age: 51 Room: [facility] ENDO ROOM 3 Gender: Male Note Status: Finalized Instrument Name: Upper Endoscope [id number] Procedure:             Upper GI endoscopy Indications:           Gastro-esophageal reflux disease Providers:             Andrey Farmer MD, MD Referring MD:          Rob Hickman [facility] [place] Medicines:             Monitored Anesthesia Care Complications:         No immediate complications. Estimated blood loss:                         Minimal. Procedure:             Pre-Anesthesia Assessment:                        - Prior to the procedure, a History and Physical was                         performed, and patient medications and allergies were                         reviewed. The patient is competent. The risks and                         benefits of the procedure and the sedation options and                         risks were discussed with the patient. All questions                         were answered and informed consent was obtained.                         Patient identification and proposed procedure were                         verified by the physician, the nurse, the                         anesthesiologist, the anesthetist and the technician                         in the endoscopy suite. Mental Status Examination:                         alert and oriented. Airway Examination: normal                         oropharyngeal airway and neck mobility. Respiratory                         Examination: clear to auscultation. CV Examination:  normal. Prophylactic Antibiotics: The patient does not                         require prophylactic antibiotics. Prior                         Anticoagulants: The patient has taken no previous                          anticoagulant or antiplatelet agents. ASA Grade                         Assessment: I - A normal, healthy patient. After                         reviewing the risks and benefits, the patient was                         deemed in satisfactory condition to undergo the                         procedure. The anesthesia plan was to use monitored                         anesthesia care (MAC). Immediately prior to                         administration of medications, the patient was                         re-assessed for adequacy to receive sedatives. The                         heart rate, respiratory rate, oxygen saturations,                         blood pressure, adequacy of pulmonary ventilation, and                         response to care were monitored throughout the                         procedure. The physical status of the patient was                         re-assessed after the procedure.                        After obtaining informed consent, the endoscope was                         passed under direct vision. Throughout the procedure,                         the patient's blood pressure, pulse, and oxygen                         saturations were monitored continuously. The Endoscope  was introduced through the mouth, and advanced to the                         second part of duodenum. The upper GI endoscopy was                         accomplished without difficulty. The patient tolerated                         the procedure well. Findings:      The examined esophagus was normal.      Patchy mild inflammation characterized by erythema was found in the       gastric antrum. Biopsies were taken with a cold forceps for Helicobacter       pylori testing. Estimated blood loss was minimal.      A single 5 mm papule (nodule) with no bleeding and no stigmata of recent       bleeding was found in the gastric antrum. Biopsies were taken with a       cold  forceps for histology. Estimated blood loss was minimal.      The exam of the stomach was otherwise normal.      The examined duodenum was normal. Impression:            - Normal esophagus.                        - Gastritis. Biopsied.                        - A single papule (nodule) found in the stomach.                         Biopsied.                        - Normal examined duodenum. Recommendation:        - Discharge patient to home.                        - Resume previous diet.                        - Continue present medications.                        - Await pathology results.                        - Perform a colonoscopy today. Procedure Code(s):     --- Professional ---                        [phone], Esophagogastroduodenoscopy, flexible,                         transoral; with biopsy, single or multiple Diagnosis Code(s):     --- Professional ---                        K29.70, Gastritis, unspecified, without bleeding  K31.89, Other diseases of stomach and duodenum                        K21.9, Gastro-esophageal reflux disease without                         esophagitis CPT copyright 2019 American Medical Association. All rights reserved. The codes documented in this report are preliminary and upon coder review may  be revised to meet current compliance requirements. Andrey Farmer MD, MD 04/30/2021 1:18:00 PM Number of Addenda: 0 Note Initiated On: 04/30/2021 12:26 PM Estimated Blood Loss:  Estimated blood loss was minimal.      [facility]

## 2021-04-30 NOTE — H&P (Signed)
Outpatient short stay form Pre-procedure 04/30/2021  Lesly Rubenstein, MD  Primary Physician: [facility]  Reason for visit:  GERD/Screening  History of present illness:    51 y/o gentleman with history of GERD here for EGD/Colonoscopy for GERD and colon cancer screening. No abdominal surgeries. No blood thinners. No family history of GI malignancies.    Current Facility-Administered Medications:    0.9 %  sodium chloride infusion, , Intravenous, Continuous, Tyjanae Bartek, Hilton Cork, MD, Last Rate: 20 mL/hr at 04/30/21 1201, New Bag at 04/30/21 1201  Medications Prior to Admission  Medication Sig Dispense Refill Last Dose   asenapine (SAPHRIS) 5 MG SUBL 24 hr tablet Place 10 mg under the tongue at bedtime.   Past Week   busPIRone (BUSPAR) 7.5 MG tablet Take 7.5 mg by mouth as needed. Take 1 to 2 tablets 1 to 2x/day   Past Week   paliperidone (INVEGA) 6 MG 24 hr tablet TAKE 1 TABLET BY MOUTH AT BEDTIME 30 tablet 2 Past Month   chlorhexidine (PERIDEX) 0.12 % solution 15 mL swish and spit bid 480 mL 0    HYDROcodone-acetaminophen (NORCO/VICODIN) 5-325 MG tablet Take 1-2 tablets by mouth every 6 (six) hours as needed for moderate pain or severe pain. (Patient not taking: Reported on 04/30/2021) 12 tablet 0 Completed Course   ibuprofen (ADVIL) 600 MG tablet Take 1 tablet (600 mg total) by mouth every 6 (six) hours as needed. 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    Physical Exam  Gen: Alert, oriented. Appears stated age.  HEENT: PERRLA. Lungs: No respiratory distress CV: RRR Abd: soft, benign, no masses Ext: No edema    Planned procedures: Proceed with EGD/colonoscopy. The patient understands the nature of the planned procedure, indications, risks, alternatives and potential complications  including but not limited to bleeding, infection, perforation, damage to internal organs and possible oversedation/side effects from anesthesia. The patient agrees and gives consent to proceed.  Please refer to procedure notes for findings, recommendations and patient disposition/instructions.     Lesly Rubenstein, MD [facility] Gastroenterology

## 2021-04-30 NOTE — Anesthesia Preprocedure Evaluation (Signed)
Anesthesia Evaluation  Patient identified by MRN, date of birth, ID band Patient awake    Reviewed: Allergy & Precautions, NPO status , Patient's Chart, lab work & pertinent test results  History of Anesthesia Complications Negative for: history of anesthetic complications  Airway Mallampati: III  TM Distance: >3 FB Neck ROM: full    Dental  (+) Chipped, Poor Dentition, Missing   Pulmonary neg pulmonary ROS, neg shortness of breath,    Pulmonary exam normal        Cardiovascular Exercise Tolerance: Good (-) angina(-) Past MI and (-) DOE negative cardio ROS Normal cardiovascular exam     Neuro/Psych PSYCHIATRIC DISORDERS negative neurological ROS  negative psych ROS   GI/Hepatic Neg liver ROS, GERD  Medicated and Controlled,  Endo/Other  negative endocrine ROS  Renal/GU negative Renal ROS  negative genitourinary   Musculoskeletal   Abdominal   Peds  Hematology negative hematology ROS (+)   Anesthesia Other Findings Past Medical History: No date: Anxiety No date: GERD (gastroesophageal reflux disease) No date: Psychosis ([facility])     Comment:  Atypical psychosis  Past Surgical History: No date: NO PAST SURGERIES  BMI    Body Mass Index: 36.62 kg/m      Reproductive/Obstetrics negative OB ROS                             Anesthesia Physical Anesthesia Plan  ASA: 3  Anesthesia Plan: General   Post-op Pain Management:    Induction: Intravenous  PONV Risk Score and Plan: Propofol infusion and TIVA  Airway Management Planned: Natural Airway and Nasal Cannula  Additional Equipment:   Intra-op Plan:   Post-operative Plan:   Informed Consent: I have reviewed the patients History and Physical, chart, labs and discussed the procedure including the risks, benefits and alternatives for the proposed anesthesia with the patient or authorized representative who has indicated his/her  understanding and acceptance.     Dental Advisory Given  Plan Discussed with: Anesthesiologist, CRNA and Surgeon  Anesthesia Plan Comments: (Patient consented for risks of anesthesia including but not limited to:  - adverse reactions to medications - risk of airway placement if required - damage to eyes, teeth, lips or other oral mucosa - nerve damage due to positioning  - sore throat or hoarseness - Damage to heart, brain, nerves, lungs, other parts of body or loss of life  Patient voiced understanding.)        Anesthesia Quick Evaluation

## 2021-04-30 NOTE — Interval H&P Note (Signed)
History and Physical Interval Note:  04/30/2021 12:33 PM  Eugene Johnson  has presented today for surgery, with the diagnosis of GERD,CCA.  The various methods of treatment have been discussed with the patient and family. After consideration of risks, benefits and other options for treatment, the patient has consented to  Procedure(s): COLONOSCOPY (N/A) ESOPHAGOGASTRODUODENOSCOPY (EGD) WITH PROPOFOL (N/A) as a surgical intervention.  The patient's history has been reviewed, patient examined, no change in status, stable for surgery.  I have reviewed the patient's chart and labs.  Questions were answered to the patient's satisfaction.     Lesly Rubenstein  Ok to proceed with EGD/Colonoscopy

## 2021-04-30 NOTE — Anesthesia Procedure Notes (Signed)
Date/Time: 04/30/2021 12:40 PM Performed by: Lily Peer, Khadejah Son, CRNA Pre-anesthesia Checklist: Patient identified, Emergency Drugs available, Suction available, Patient being monitored and Timeout performed Patient Re-evaluated:Patient Re-evaluated prior to induction Oxygen Delivery Method: Simple face mask Induction Type: IV induction

## 2021-04-30 NOTE — Op Note (Signed)
[facility] Patient Name: Eugene Johnson Procedure Date: 04/30/2021 12:25 PM MRN: [id number] Account #: [id number] Date of Birth: 1970-06-07 Admit Type: Outpatient Age: 51 Room: [facility] ENDO ROOM 3 Gender: Male Note Status: Finalized Instrument Name: Jasper Riling [id number] Procedure:             Colonoscopy Indications:           Screening for colorectal malignant neoplasm Providers:             Andrey Farmer MD, MD Referring MD:          Rob Hickman Primary [place] Medicines:             Monitored Anesthesia Care Complications:         No immediate complications. Estimated blood loss:                         Minimal. Procedure:             Pre-Anesthesia Assessment:                        - Prior to the procedure, a History and Physical was                         performed, and patient medications and allergies were                         reviewed. The patient is competent. The risks and                         benefits of the procedure and the sedation options and                         risks were discussed with the patient. All questions                         were answered and informed consent was obtained.                         Patient identification and proposed procedure were                         verified by the physician, the nurse, the                         anesthesiologist, the anesthetist and the technician                         in the endoscopy suite. Mental Status Examination:                         alert and oriented. Airway Examination: normal                         oropharyngeal airway and neck mobility. Respiratory                         Examination: clear to auscultation. CV Examination:  normal. Prophylactic Antibiotics: The patient does not                         require prophylactic antibiotics. Prior                         Anticoagulants: The patient has taken no previous                          anticoagulant or antiplatelet agents. ASA Grade                         Assessment: I - A normal, healthy patient. After                         reviewing the risks and benefits, the patient was                         deemed in satisfactory condition to undergo the                         procedure. The anesthesia plan was to use monitored                         anesthesia care (MAC). Immediately prior to                         administration of medications, the patient was                         re-assessed for adequacy to receive sedatives. The                         heart rate, respiratory rate, oxygen saturations,                         blood pressure, adequacy of pulmonary ventilation, and                         response to care were monitored throughout the                         procedure. The physical status of the patient was                         re-assessed after the procedure.                        After obtaining informed consent, the colonoscope was                         passed under direct vision. Throughout the procedure,                         the patient's blood pressure, pulse, and oxygen                         saturations were monitored continuously. The  Colonoscope was introduced through the anus and                         advanced to the the terminal ileum. The colonoscopy                         was performed without difficulty. The patient                         tolerated the procedure well. The quality of the bowel                         preparation was good. Findings:      The perianal and digital rectal examinations were normal.      The terminal ileum appeared normal.      A 1 mm polyp was found in the ascending colon. The polyp was sessile.       The polyp was removed with a jumbo cold forceps. Resection and retrieval       were complete. Estimated blood loss was minimal.      A 3 mm polyp was found in the transverse  colon. The polyp was sessile.       The polyp was removed with a cold snare. Resection and retrieval were       complete. Estimated blood loss was minimal.      A 2 mm polyp was found in the rectum. The polyp was hyperplastic. The       polyp was removed with a jumbo cold forceps. Resection and retrieval       were complete. Estimated blood loss was minimal.      Internal hemorrhoids were found during retroflexion. The hemorrhoids       were Grade I (internal hemorrhoids that do not prolapse).      The exam was otherwise without abnormality on direct and retroflexion       views. Impression:            - The examined portion of the ileum was normal.                        - One 1 mm polyp in the ascending colon, removed with                         a jumbo cold forceps. Resected and retrieved.                        - One 3 mm polyp in the transverse colon, removed with                         a cold snare. Resected and retrieved.                        - One 2 mm polyp in the rectum, removed with a jumbo                         cold forceps. Resected and retrieved.                        - Internal hemorrhoids.                        -  The examination was otherwise normal on direct and                         retroflexion views. Recommendation:        - Discharge patient to home.                        - Resume previous diet.                        - Continue present medications.                        - Await pathology results.                        - Repeat colonoscopy for surveillance based on                         pathology results.                        - Return to referring physician as previously                         scheduled. Procedure Code(s):     --- Professional ---                        [phone], Colonoscopy, flexible; with removal of                         tumor(s), polyp(s), or other lesion(s) by snare                         technique                         45380, 1, Colonoscopy, flexible; with biopsy, single                         or multiple Diagnosis Code(s):     --- Professional ---                        Z12.11, Encounter for screening for malignant neoplasm                         of colon                        K64.0, First degree hemorrhoids                        K63.5, Polyp of colon                        K62.1, Rectal polyp CPT copyright 2019 American Medical Association. All rights reserved. The codes documented in this report are preliminary and upon coder review may  be revised to meet current compliance requirements. Andrey Farmer MD, MD 04/30/2021 1:21:26 PM Number of Addenda: 0 Note Initiated On: 04/30/2021 12:25 PM Scope Withdrawal Time: 0 hours 12 minutes 24 seconds  Total Procedure Duration: 0 hours 14 minutes 18  seconds  Estimated Blood Loss:  Estimated blood loss was minimal.      [facility]

## 2021-05-03 ENCOUNTER — Encounter: Payer: Self-pay | Admitting: Gastroenterology

## 2021-05-03 LAB — SURGICAL PATHOLOGY

## 2021-05-03 NOTE — Anesthesia Postprocedure Evaluation (Signed)
Anesthesia Post Note  Patient: Future Yeldell  Procedure(s) Performed: COLONOSCOPY ESOPHAGOGASTRODUODENOSCOPY (EGD) WITH PROPOFOL  Patient location during evaluation: Endoscopy Anesthesia Type: General Level of consciousness: awake and alert Pain management: pain level controlled Vital Signs Assessment: post-procedure vital signs reviewed and stable Respiratory status: spontaneous breathing, nonlabored ventilation, respiratory function stable and patient connected to nasal cannula oxygen Cardiovascular status: blood pressure returned to baseline and stable Postop Assessment: no apparent nausea or vomiting Anesthetic complications: no   No notable events documented.   Last Vitals:  Vitals:   04/30/21 1327 04/30/21 1339  BP: 112/77 (!) 135/95  Pulse: (!) 56 (!) 50  Resp: 19 12  Temp:    SpO2: 95% 98%    Last Pain:  Vitals:   05/03/21 0740  TempSrc:   PainSc: 0-No pain                 Precious Haws Ard[place]

## 2022-06-05 ENCOUNTER — Ambulatory Visit
Admission: EM | Admit: 2022-06-05 | Discharge: 2022-06-05 | Disposition: A | Discharge status: Home / Self Care | Payer: No Typology Code available for payment source | Attending: Family Medicine | Admitting: Family Medicine

## 2022-06-05 ENCOUNTER — Ambulatory Visit (INDEPENDENT_AMBULATORY_CARE_PROVIDER_SITE_OTHER): Payer: No Typology Code available for payment source

## 2022-06-05 DIAGNOSIS — R059 Cough, unspecified: Secondary | ICD-10-CM | POA: Diagnosis not present

## 2022-06-05 DIAGNOSIS — K219 Gastro-esophageal reflux disease without esophagitis: Secondary | ICD-10-CM | POA: Diagnosis not present

## 2022-06-05 DIAGNOSIS — M47814 Spondylosis without myelopathy or radiculopathy, thoracic region: Secondary | ICD-10-CM | POA: Insufficient documentation

## 2022-06-05 DIAGNOSIS — F419 Anxiety disorder, unspecified: Secondary | ICD-10-CM | POA: Diagnosis not present

## 2022-06-05 DIAGNOSIS — Z1152 Encounter for screening for COVID-19: Secondary | ICD-10-CM | POA: Insufficient documentation

## 2022-06-05 DIAGNOSIS — H9209 Otalgia, unspecified ear: Secondary | ICD-10-CM | POA: Diagnosis not present

## 2022-06-05 DIAGNOSIS — J210 Acute bronchiolitis due to respiratory syncytial virus: Secondary | ICD-10-CM | POA: Diagnosis present

## 2022-06-05 LAB — GROUP A STREP BY PCR: Group A Strep by PCR: NOT DETECTED

## 2022-06-05 LAB — RESP PANEL BY RT-PCR (RSV, FLU A&B, COVID)  RVPGX2
Influenza A by PCR: NEGATIVE
Influenza B by PCR: NEGATIVE
Resp Syncytial Virus by PCR: POSITIVE — AB
SARS Coronavirus 2 by RT PCR: NEGATIVE

## 2022-06-05 MED ORDER — PROMETHAZINE-DM 6.25-15 MG/5ML PO SYRP: 5.0000 mL | ORAL_SOLUTION | Freq: Four times a day (QID) | ORAL | 0 refills | Status: DC | PRN

## 2022-06-05 MED ORDER — IPRATROPIUM BROMIDE 0.06 % NA SOLN: 2.0000 | Freq: Four times a day (QID) | NASAL | 12 refills | Status: DC

## 2022-06-05 MED ORDER — PREDNISONE 50 MG PO TABS: 50.0000 mg | ORAL_TABLET | Freq: Every day | ORAL | 0 refills | Status: AC

## 2022-06-05 NOTE — Discharge Instructions (Addendum)
Your COVID and influenza test were negative however your RSV (respiratory syncytial virus) test is positive.  This is the likely cause of your bronchitis.  Your chest x-ray did not show pneumonia.  The symptoms from RSV may last for several weeks.   Stop by the pharmacy to pick up your steroids and cough syrup.  Follow up with your primary care provider as needed.

## 2022-06-05 NOTE — ED Provider Notes (Signed)
[facility]    CSN: [id number] Arrival date & time: 06/05/22  1304      History   Chief Complaint No chief complaint on file.   HPI Eugene Johnson is a 52 y.o. male.   HPI   Eugene Johnson presents for cough that started about 2 weeks ago.  He was in bed for 3 days with severe body aches that it spread to his lungs. He feels horrible. Feels like he gets a fever that lasts for about minutes then goes away. He has brown sputum. Endorses chills, ear pain.  Sore throat and body aches have mostly resolved. He has decreased appetite. Has diarrhea now. Has a 1.5 yo child at home.         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 Authorizing Provider  diclofenac (VOLTAREN) 75 MG EC tablet Take 1 tablet by mouth 2 (two) times daily.   Yes [provider]  ipratropium (ATROVENT) 0.06 % nasal spray Place 2 sprays into both nostrils 4 (four) times daily. 06/05/22  Yes Pamelia Botto, DO  predniSONE (DELTASONE) 50 MG tablet Take 1 tablet (50 mg total) by mouth daily for 5 days. 06/05/22 06/10/22 Yes Shaketha Jeon, DO  promethazine-dextromethorphan (PROMETHAZINE-DM) 6.25-15 MG/5ML syrup Take 5 mLs  by mouth 4 (four) times daily as needed. 06/05/22  Yes Damarri Rampy, DO  asenapine (SAPHRIS) 5 MG SUBL 24 hr tablet Place 10 mg under the tongue at bedtime.    [provider]  busPIRone (BUSPAR) 7.5 MG tablet Take 7.5 mg by mouth as needed. Take 1 to 2 tablets 1 to 2x/day    [provider]  chlorhexidine (PERIDEX) 0.12 % solution 15 mL swish and spit bid 08/01/20   Melynda Ripple, MD  HYDROcodone-acetaminophen (NORCO/VICODIN) 5-325 MG tablet Take 1-2 tablets by mouth every 6 (six) hours as needed for moderate pain or severe pain. Patient not taking: Reported on 04/30/2021 08/01/20   Melynda Ripple, MD  ibuprofen (ADVIL) 600 MG tablet Take 1 tablet (600 mg total) by mouth every 6 (six) hours as needed. 08/01/20   Melynda Ripple, MD  paliperidone (INVEGA) 6 MG 24 hr tablet TAKE 1 TABLET BY MOUTH AT BEDTIME 01/28/19   Clapacs, Madie Reno, MD    Family History History reviewed. No pertinent family history.  Social History Social History   Tobacco Use   Smoking status: Never   Smokeless tobacco: Never  Vaping Use   Vaping Use: Former   Quit date: 02/09/2021  Substance Use Topics   Alcohol use: Not Currently   Drug use: Never     Allergies   Patient has no known allergies.   Review of Systems Review of Systems: negative  unless otherwise stated in HPI.      Physical Exam Triage Vital Signs ED Triage Vitals  Enc Vitals Group     BP 06/05/22 1319 127/76     Pulse Rate 06/05/22 1319 (!) 59     Resp 06/05/22 1319 18     Temp 06/05/22 1319 98.3 F (36.8 C)     Temp Source 06/05/22 1319 Oral     SpO2 06/05/22 1319 97 %     Weight 06/05/22 1317 270 lb (122.5 kg)     Height --      Head Circumference --      Peak Flow --      Pain Score 06/05/22 1316 0     Pain Loc --      Pain Edu? --      Excl. in [place]? --    No data found.  Updated Vital Signs BP 127/76 (BP Location: Left Arm)   Pulse (!) 59   Temp 98.3 F (36.8 C) (Oral)   Resp 18   Wt 122.5  kg   SpO2 97%   BMI 36.62 kg/m   Visual Acuity Right Eye Distance:   Left Eye Distance:   Bilateral Distance:    Right Eye Near:   Left Eye Near:    Bilateral Near:     Physical Exam GEN:     alert, non-toxic appearing male in no distress     HENT:  mucus membranes moist, oropharyngeal  without lesions or  exudate, no tonsillar hypertrophy, mild oropharyngeal erythema, moderate erythematous edematous turbinates, clear nasal discharge EYES:   pupils equal and reactive, no scleral injection or discharge NECK:  normal ROM, no meningismus   RESP:  no increased work of breathing, clear to auscultation bilaterally CVS:   regular rate and rhythm Skin:   warm and dry, no rash on visible skin    UC Treatments / Results  Labs (all labs ordered are listed, but only abnormal results are displayed) Labs Reviewed  RESP PANEL BY RT-PCR (RSV, FLU A&B, COVID)  RVPGX2 - Abnormal; Notable for the following components:      Result Value   Resp Syncytial Virus by PCR POSITIVE (*)    All other components within normal limits  GROUP A STREP BY PCR    EKG   Radiology DG Chest 2 View  Result Date: 06/05/2022 CLINICAL DATA:  Cough for 2 weeks. EXAM: CHEST - 2 VIEW COMPARISON:  None Available. FINDINGS: Cardiac silhouette and mediastinal contours are within normal limits. The lungs are clear. No pleural effusion or pneumothorax. Mild-to-moderate multilevel degenerative disc changes of the thoracic spine IMPRESSION: No active cardiopulmonary disease. Electronically Signed   By: Yvonne Kendall M.D.   On: 06/05/2022 13:59    Procedures Procedures (including critical care time)  Medications Ordered in UC Medications - No data to display  Initial Impression / Assessment and Plan / UC Course  I have reviewed the triage vital signs and the nursing notes.  Pertinent labs & imaging results that were available during my care of the patient were reviewed by me and considered in my medical decision  making (see chart for details).       Pt is a 52 y.o. male who presents for ongoing respiratory and new gastrointestinal symptoms. Eugene Johnson is afebrile here without recent antipyretics. Satting well on room air. Overall pt is non-toxic appearing, well hydrated, without respiratory distress. Pulmonary exam is unremarkable.  Chest xray personally reviewed by me without focal pneumonia, pleural  effusion, cardiomegaly or pneumothorax.  COVID and influenza testing obtained and were negative. RSV is positive. Given pt reports cough and congestion has been present for 2 weeks will give cough syrup, Atrovent nasal spray and steroid burst to see if that helps with his RSV infection.  Discussed symptomatic treatment.  Explained lack of efficacy of antibiotics in viral disease.  Typical duration of symptoms discussed.   Return and ED precautions given and voiced understanding. Discussed MDM, treatment plan and plan for follow-up with patient who agrees with plan.     Final Clinical Impressions(s) / UC Diagnoses   Final diagnoses:  Acute bronchiolitis due to respiratory syncytial virus (RSV)     Discharge Instructions      Your COVID and influenza test were negative however your RSV (respiratory syncytial virus) test is positive.  This is the likely cause of your bronchitis.  Your chest x-ray did not show pneumonia.  The symptoms from RSV may last for several weeks.   Stop by the pharmacy to pick up your steroids and cough syrup.  Follow up with your primary care provider as needed.      ED Prescriptions     Medication Sig Dispense Auth. Provider   predniSONE (DELTASONE) 50 MG tablet Take 1 tablet (50 mg total) by mouth daily for 5 days. 5 tablet Ellizabeth Dacruz, DO   promethazine-dextromethorphan (PROMETHAZINE-DM) 6.25-15 MG/5ML syrup Take 5 mLs by mouth 4 (four) times daily as needed. 118 mL Tulani Kidney, DO   ipratropium (ATROVENT) 0.06 % nasal spray Place 2 sprays into both nostrils 4  (four) times daily. 15 mL Lyndee Hensen, DO      PDMP not reviewed this encounter.   Lyndee Hensen, DO 06/05/22 1503

## 2022-06-05 NOTE — ED Triage Notes (Signed)
Pt states that 2 weeks ago he got sick. Was in the bed for 3 days with body aches. He's still congestion, fatigue. Brown mucus. Feels like he has a temp but hasn't taken. Has been taking Alka-Seltzer Plus with no relief.

## 2022-11-23 ENCOUNTER — Ambulatory Visit
Admission: EM | Admit: 2022-11-23 | Discharge: 2022-11-23 | Disposition: A | Discharge status: Home / Self Care | Payer: No Typology Code available for payment source | Attending: Emergency Medicine | Admitting: Emergency Medicine

## 2022-11-23 DIAGNOSIS — J069 Acute upper respiratory infection, unspecified: Secondary | ICD-10-CM | POA: Insufficient documentation

## 2022-11-23 LAB — GROUP A STREP BY PCR: Group A Strep by PCR: NOT DETECTED

## 2022-11-23 MED ORDER — PROMETHAZINE-DM 6.25-15 MG/5ML PO SYRP: 5.0000 mL | ORAL_SOLUTION | Freq: Four times a day (QID) | ORAL | 0 refills | Status: AC | PRN

## 2022-11-23 MED ORDER — BENZONATATE 100 MG PO CAPS: 200.0000 mg | ORAL_CAPSULE | Freq: Three times a day (TID) | ORAL | 0 refills | Status: AC

## 2022-11-23 MED ORDER — IPRATROPIUM BROMIDE 0.06 % NA SOLN: 2.0000 | Freq: Four times a day (QID) | NASAL | 12 refills | Status: AC

## 2022-11-23 NOTE — Discharge Instructions (Signed)
Your strep test today was negative.  Your exam is consistent with a viral upper respiratory infection.  Please use over-the-counter Tylenol and/or ibuprofen according to package instructions as needed for pain or fever.  For your throat you may use over-the-counter Chloraseptic or Sucrets lozenges, along with salt gargles, to help soothe the tissues.  Use the Atrovent nasal spray, 2 squirts in each nostril every 6 hours, as needed for runny nose and postnasal drip.  Use the Tessalon Perles every 8 hours during the day.  Take them with a small sip of water.  They may give you some numbness to the base of your tongue or a metallic taste in your mouth, this is normal.  Use the Promethazine DM cough syrup at bedtime for cough and congestion.  It will make you drowsy so do not take it during the day.  Return for reevaluation or see your primary care provider for any new or worsening symptoms.

## 2022-11-23 NOTE — ED Provider Notes (Signed)
[facility]    CSN: [id number] Arrival date & time: 11/23/22  0803      History   Chief Complaint Chief Complaint  Patient presents with   Generalized Body Aches   Nasal Congestion   Fever   Cough    HPI Eugene Johnson is a 52 y.o. male.   HPI  52 year old male with a past medical history significant for anxiety, psychosis, and GERD presents for evaluation of 4 days worth of URI symptoms.  He reports that his symptoms began with a sore throat 4 days ago and then progressed to include nasal congestion, body aches, and cough that started to become productive this morning.  He does endorse some mild shortness of breath but denies wheezing.  Patient is able to speak in full sentence without dyspnea or tachypnea in the exam room.  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   Auditory hallucinations 10/14/2018    Past Surgical History:  Procedure Laterality Date   COLONOSCOPY N/A 04/30/2021   Procedure: COLONOSCOPY;  Surgeon: Regis Bill, MD;  Location: [facility] ENDOSCOPY;  Service: Endoscopy;  Laterality: N/A;   ESOPHAGOGASTRODUODENOSCOPY (EGD) WITH PROPOFOL N/A 04/30/2021   Procedure: ESOPHAGOGASTRODUODENOSCOPY (EGD) WITH PROPOFOL;  Surgeon: Regis Bill, MD;  Location: [facility] ENDOSCOPY;  Service: Endoscopy;  Laterality: N/A;   NO PAST SURGERIES         Home Medications    Prior to Admission medications   Medication Sig Start Date End Date Taking? Authorizing Provider  benzonatate (TESSALON) 100 MG capsule Take 2 capsules (200 mg total) by mouth every 8 (eight) hours. 11/23/22  Yes Becky Augusta, NP  ipratropium (ATROVENT) 0.06 % nasal spray Place 2 sprays into both  nostrils 4 (four) times daily. 11/23/22  Yes Becky Augusta, NP  promethazine-dextromethorphan (PROMETHAZINE-DM) 6.25-15 MG/5ML syrup Take 5 mLs by mouth 4 (four) times daily as needed. 11/23/22  Yes Becky Augusta, NP  asenapine (SAPHRIS) 5 MG SUBL 24 hr tablet Place 10 mg under the tongue at bedtime.    [provider]  busPIRone (BUSPAR) 7.5 MG tablet Take 7.5 mg by mouth as needed. Take 1 to 2 tablets 1 to 2x/day    [provider]  chlorhexidine (PERIDEX) 0.12 % solution 15 mL swish and spit bid 08/01/20   Domenick Gong, MD  diclofenac (VOLTAREN) 75 MG EC tablet Take 1 tablet by mouth 2 (two) times daily.    [provider]  HYDROcodone-acetaminophen (NORCO/VICODIN) 5-325 MG tablet Take 1-2 tablets by mouth every 6 (six) hours as needed for moderate pain or severe pain. Patient not taking: Reported on 04/30/2021 08/01/20   Domenick Gong, MD  ibuprofen (ADVIL) 600 MG tablet Take 1 tablet (600 mg total) by mouth every 6 (six) hours as needed. 08/01/20   Domenick Gong, MD  paliperidone (INVEGA) 6 MG 24 hr tablet TAKE 1 TABLET BY MOUTH AT BEDTIME 01/28/19   Clapacs, Jackquline Denmark, MD    Family History History reviewed. No pertinent family history.  Social History Social History   Tobacco Use   Smoking status: Never   Smokeless tobacco: Never  Vaping Use   Vaping status: Former   Quit date: 02/09/2021  Substance Use Topics   Alcohol use: Not Currently   Drug use: Never     Allergies   Patient has no known allergies.   Review of Systems Review of Systems  Constitutional:  Negative for fever.  HENT:  Positive for congestion, rhinorrhea and sore throat. Negative for ear pain.   Respiratory:  Positive for cough and shortness of breath. Negative for wheezing.   Musculoskeletal:  Positive for arthralgias and myalgias.     Physical Exam Triage Vital Signs ED Triage Vitals  Encounter Vitals Group     BP      Systolic BP Percentile      Diastolic BP  Percentile      Pulse      Resp      Temp      Temp src      SpO2      Weight      Height      Head Circumference      Peak Flow      Pain Score      Pain Loc      Pain Education      Exclude from Growth Chart    No data found.  Updated Vital Signs BP 132/83 (BP Location: Left Arm)   Pulse (!) 55   Temp 98.4 F (36.9 C) (Oral)   Resp 20   Wt 250 lb (113.4 kg)   SpO2 96%   BMI 33.91 kg/m   Visual Acuity Right Eye Distance:   Left Eye Distance:   Bilateral Distance:    Right Eye Near:   Left Eye Near:    Bilateral Near:     Physical Exam Vitals and nursing note reviewed.  Constitutional:      Appearance: Normal appearance. He is not ill-appearing.  HENT:     Head: Normocephalic and atraumatic.     Right Ear: Tympanic membrane, ear canal and external ear normal. There is no impacted cerumen.     Left Ear: Tympanic membrane, ear canal and external ear normal. There is no impacted cerumen.     Nose: Congestion and rhinorrhea present.     Comments: Patient mucosa is erythematous and mildly edematous with clear discharge in both nares.    Mouth/Throat:     Mouth: Mucous membranes are moist.     Pharynx: Oropharynx is clear. Posterior oropharyngeal erythema present. No oropharyngeal exudate.     Comments: Erythema and injection to the posterior oropharynx with clear postnasal drip.  No exudate appreciated. Cardiovascular:     Rate and Rhythm: Normal rate and regular rhythm.     Pulses: Normal pulses.     Heart sounds: Normal heart sounds. No murmur heard.    No friction rub. No gallop.  Pulmonary:     Effort: Pulmonary effort is normal.     Breath sounds: Normal breath sounds. No wheezing, rhonchi or rales.  Musculoskeletal:     Cervical back: Normal range of motion and neck supple. No tenderness.  Lymphadenopathy:     Cervical: No cervical adenopathy.  Skin:    General: Skin is warm and dry.     Capillary Refill: Capillary refill takes less than 2 seconds.      Findings: No erythema or rash.  Neurological:     General: No focal deficit present.     Mental Status: He is alert and oriented to person, place, and time.      UC Treatments / Results  Labs (all labs ordered are listed,  but only abnormal results are displayed) Labs Reviewed  GROUP A STREP BY PCR    EKG   Radiology No results found.  Procedures Procedures (including critical care time)  Medications Ordered in UC Medications - No data to display  Initial Impression / Assessment and Plan / UC Course  I have reviewed the triage vital signs and the nursing notes.  Pertinent labs & imaging results that were available during my care of the patient were reviewed by me and considered in my medical decision making (see chart for details).   The patient is a nontoxic-appearing 52 year old male presenting for evaluation of 4 days worth of respiratory illness symptoms as outlined HPI above.  His wife's grandson was recently diagnosed with a virus by his PCP after coming home from daycare.  The patient reports that everyone in the house contracted similar symptoms.  Patient's physical exam does reveal inflammation of the upper respiratory tract but his cardiopulmonary exam is benign.  Given that his symptoms started with a sore throat I will order a strep PCR.  Strep PCR is negative.  I will discharge patient home with a diagnosis of viral URI with a cough and treated with Atrovent nasal spray, Tessalon Perles, and Promethazine DM cough syrup.  Tylenol and ibuprofen as needed for pain.  Return precautions reviewed.  Work note provided.   Final Clinical Impressions(s) / UC Diagnoses   Final diagnoses:  Viral URI with cough     Discharge Instructions      Your strep test today was negative.  Your exam is consistent with a viral upper respiratory infection.  Please use over-the-counter Tylenol and/or ibuprofen according to package instructions as needed for pain or fever.  For  your throat you may use over-the-counter Chloraseptic or Sucrets lozenges, along with salt gargles, to help soothe the tissues.  Use the Atrovent nasal spray, 2 squirts in each nostril every 6 hours, as needed for runny nose and postnasal drip.  Use the Tessalon Perles every 8 hours during the day.  Take them with a small sip of water.  They may give you some numbness to the base of your tongue or a metallic taste in your mouth, this is normal.  Use the Promethazine DM cough syrup at bedtime for cough and congestion.  It will make you drowsy so do not take it during the day.  Return for reevaluation or see your primary care provider for any new or worsening symptoms.      ED Prescriptions     Medication Sig Dispense Auth. Provider   benzonatate (TESSALON) 100 MG capsule Take 2 capsules (200 mg total) by mouth every 8 (eight) hours. 21 capsule Becky Augusta, NP   ipratropium (ATROVENT) 0.06 % nasal spray Place 2 sprays into both nostrils 4 (four) times daily. 15 mL Becky Augusta, NP   promethazine-dextromethorphan (PROMETHAZINE-DM) 6.25-15 MG/5ML syrup Take 5 mLs by mouth 4 (four) times daily as needed. 118 mL Becky Augusta, NP      PDMP not reviewed this encounter.   Becky Augusta, NP 11/23/22 [phone]

## 2022-11-23 NOTE — ED Triage Notes (Signed)
Sx started Sat. Sore throat,congestion,body aches. Patient states that his throat his no longer sore. He still has body aches,chest and nasal congestion,cough. Pt states that fever comes and goes. OTC sinus meds.
